# Patient Record
Sex: Male | Born: 1970 | Race: White | Hispanic: No | Marital: Married | Smoking: Never smoker
Health system: Southern US, Community
[De-identification: ages and names within clinical notes are randomized; demographics above are authoritative.]

## PROBLEM LIST (undated history)

## (undated) DIAGNOSIS — K602 Anal fissure, unspecified: Secondary | ICD-10-CM

## (undated) DIAGNOSIS — R0981 Nasal congestion: Secondary | ICD-10-CM

## (undated) DIAGNOSIS — M1990 Unspecified osteoarthritis, unspecified site: Secondary | ICD-10-CM

## (undated) DIAGNOSIS — K921 Melena: Secondary | ICD-10-CM

## (undated) DIAGNOSIS — C801 Malignant (primary) neoplasm, unspecified: Secondary | ICD-10-CM

## (undated) DIAGNOSIS — G43909 Migraine, unspecified, not intractable, without status migrainosus: Secondary | ICD-10-CM

## (undated) HISTORY — PX: REPAIR HYPOSPADIAS W/ URETHROPLASTY: SUR1184

## (undated) HISTORY — DX: Nasal congestion: R09.81

## (undated) HISTORY — DX: Unspecified osteoarthritis, unspecified site: M19.90

## (undated) HISTORY — DX: Anal fissure, unspecified: K60.2

## (undated) HISTORY — PX: LIPOMA EXCISION: SHX5283

## (undated) HISTORY — DX: Malignant (primary) neoplasm, unspecified: C80.1

## (undated) HISTORY — DX: Migraine, unspecified, not intractable, without status migrainosus: G43.909

## (undated) HISTORY — DX: Melena: K92.1

---

## 1998-12-22 ENCOUNTER — Ambulatory Visit (HOSPITAL_BASED_OUTPATIENT_CLINIC_OR_DEPARTMENT_OTHER): Admission: RE | Admit: 1998-12-22 | Discharge: 1998-12-22 | Payer: Self-pay | Admitting: General Surgery

## 2000-03-20 HISTORY — PX: ANAL FISSURE REPAIR: SHX2312

## 2000-03-20 HISTORY — PX: PILONIDAL CYST EXCISION: SHX744

## 2001-06-24 ENCOUNTER — Ambulatory Visit (HOSPITAL_BASED_OUTPATIENT_CLINIC_OR_DEPARTMENT_OTHER): Admission: RE | Admit: 2001-06-24 | Discharge: 2001-06-24 | Payer: Self-pay | Admitting: General Surgery

## 2001-06-24 ENCOUNTER — Encounter (INDEPENDENT_AMBULATORY_CARE_PROVIDER_SITE_OTHER): Payer: Self-pay | Admitting: *Deleted

## 2001-11-12 ENCOUNTER — Ambulatory Visit (HOSPITAL_BASED_OUTPATIENT_CLINIC_OR_DEPARTMENT_OTHER): Admission: RE | Admit: 2001-11-12 | Discharge: 2001-11-12 | Payer: Self-pay | Admitting: General Surgery

## 2001-11-12 ENCOUNTER — Encounter (INDEPENDENT_AMBULATORY_CARE_PROVIDER_SITE_OTHER): Payer: Self-pay | Admitting: *Deleted

## 2003-03-12 ENCOUNTER — Emergency Department (HOSPITAL_COMMUNITY): Admission: AD | Admit: 2003-03-12 | Discharge: 2003-03-12 | Payer: Self-pay | Admitting: Family Medicine

## 2004-03-18 ENCOUNTER — Ambulatory Visit: Payer: Self-pay | Admitting: Family Medicine

## 2004-08-12 ENCOUNTER — Ambulatory Visit: Payer: Self-pay | Admitting: Family Medicine

## 2004-12-01 ENCOUNTER — Ambulatory Visit: Payer: Self-pay | Admitting: Family Medicine

## 2005-01-28 ENCOUNTER — Ambulatory Visit: Payer: Self-pay | Admitting: Family Medicine

## 2005-03-22 ENCOUNTER — Ambulatory Visit: Payer: Self-pay | Admitting: Family Medicine

## 2005-04-24 ENCOUNTER — Ambulatory Visit: Payer: Self-pay | Admitting: Family Medicine

## 2005-06-28 ENCOUNTER — Ambulatory Visit: Payer: Self-pay | Admitting: Family Medicine

## 2005-08-10 ENCOUNTER — Ambulatory Visit: Payer: Self-pay | Admitting: Family Medicine

## 2006-03-08 ENCOUNTER — Ambulatory Visit: Payer: Self-pay | Admitting: Family Medicine

## 2006-05-11 ENCOUNTER — Ambulatory Visit: Payer: Self-pay | Admitting: Family Medicine

## 2006-07-11 ENCOUNTER — Ambulatory Visit: Payer: Self-pay | Admitting: Family Medicine

## 2006-10-12 ENCOUNTER — Ambulatory Visit: Payer: Self-pay | Admitting: Family Medicine

## 2006-10-12 DIAGNOSIS — G43909 Migraine, unspecified, not intractable, without status migrainosus: Secondary | ICD-10-CM | POA: Insufficient documentation

## 2006-10-12 LAB — CONVERTED CEMR LAB
Bilirubin Urine: NEGATIVE
Blood in Urine, dipstick: NEGATIVE
Glucose, Urine, Semiquant: NEGATIVE
Protein, U semiquant: NEGATIVE
Specific Gravity, Urine: 1020

## 2006-11-20 DIAGNOSIS — J069 Acute upper respiratory infection, unspecified: Secondary | ICD-10-CM | POA: Insufficient documentation

## 2006-11-27 ENCOUNTER — Ambulatory Visit: Payer: Self-pay | Admitting: Family Medicine

## 2006-12-13 ENCOUNTER — Ambulatory Visit: Payer: Self-pay | Admitting: Family Medicine

## 2006-12-13 DIAGNOSIS — J45909 Unspecified asthma, uncomplicated: Secondary | ICD-10-CM | POA: Insufficient documentation

## 2007-04-05 ENCOUNTER — Ambulatory Visit: Payer: Self-pay | Admitting: Family Medicine

## 2007-04-05 LAB — CONVERTED CEMR LAB: Rapid Strep: NEGATIVE

## 2007-05-31 ENCOUNTER — Telehealth: Payer: Self-pay | Admitting: Family Medicine

## 2007-05-31 ENCOUNTER — Ambulatory Visit (HOSPITAL_BASED_OUTPATIENT_CLINIC_OR_DEPARTMENT_OTHER): Admission: RE | Admit: 2007-05-31 | Discharge: 2007-05-31 | Payer: Self-pay | Admitting: Urology

## 2007-06-19 ENCOUNTER — Telehealth: Payer: Self-pay | Admitting: Family Medicine

## 2007-07-01 ENCOUNTER — Ambulatory Visit: Payer: Self-pay | Admitting: Family Medicine

## 2007-07-01 DIAGNOSIS — D696 Thrombocytopenia, unspecified: Secondary | ICD-10-CM | POA: Insufficient documentation

## 2007-07-01 LAB — CONVERTED CEMR LAB
Eosinophils Relative: 1.2 % (ref 0.0–5.0)
HCT: 45.1 % (ref 39.0–52.0)
Lymphocytes Relative: 26.9 % (ref 12.0–46.0)
Monocytes Absolute: 0.4 10*3/uL (ref 0.1–1.0)
Monocytes Relative: 8 % (ref 3.0–12.0)
Platelets: 123 10*3/uL — ABNORMAL LOW (ref 150–400)
WBC: 5.6 10*3/uL (ref 4.5–10.5)

## 2008-03-19 ENCOUNTER — Telehealth: Payer: Self-pay | Admitting: *Deleted

## 2008-08-04 DIAGNOSIS — K5289 Other specified noninfective gastroenteritis and colitis: Secondary | ICD-10-CM

## 2008-08-05 ENCOUNTER — Ambulatory Visit: Payer: Self-pay | Admitting: Family Medicine

## 2008-10-09 ENCOUNTER — Ambulatory Visit: Payer: Self-pay | Admitting: Family Medicine

## 2008-10-09 DIAGNOSIS — H10029 Other mucopurulent conjunctivitis, unspecified eye: Secondary | ICD-10-CM

## 2008-11-13 ENCOUNTER — Ambulatory Visit: Payer: Self-pay | Admitting: Family Medicine

## 2008-11-13 DIAGNOSIS — J309 Allergic rhinitis, unspecified: Secondary | ICD-10-CM | POA: Insufficient documentation

## 2009-02-15 ENCOUNTER — Ambulatory Visit: Payer: Self-pay | Admitting: Family Medicine

## 2009-05-25 ENCOUNTER — Ambulatory Visit: Payer: Self-pay | Admitting: Family Medicine

## 2010-01-18 HISTORY — PX: SKIN CANCER EXCISION: SHX779

## 2010-01-28 ENCOUNTER — Ambulatory Visit: Payer: Self-pay | Admitting: Family Medicine

## 2010-01-28 DIAGNOSIS — J019 Acute sinusitis, unspecified: Secondary | ICD-10-CM | POA: Insufficient documentation

## 2010-04-19 NOTE — Assessment & Plan Note (Signed)
Summary: SINUSITIS? // RS   Vital Signs:  Patient profile:   40 year old male Temp:     98.2 degrees F oral BP sitting:   120 / 90  (left arm) Cuff size:   regular  Vitals Entered By: Sid Falcon LPN (January 28, 2010 2:41 PM)  History of Present Illness: Patient seen with onset sometime around mid to late October cold-like symptoms. Progressive facial pain. Seen in urgent care Center in [place] started on Keflex. Recently finished a course had relapse. Has nasal congestion, maxillary facial pain, upper teeth pain. Increased greenish nasal discharge. Using Flonase and Sudafed without much relief. Also does saline irrigation. No recent fever. Allergy to penicillin.   Allergies (verified): No Known Drug Allergies  Past History:  Past Medical History: Last updated: 10/12/2006 migraine headaches  Physical Exam  General:  Well-developed,well-nourished,in no acute distress; alert,appropriate and cooperative throughout examination Ears:  External ear exam shows no significant lesions or deformities.  Otoscopic examination reveals clear canals, tympanic membranes are intact bilaterally without bulging, retraction, inflammation or discharge. Hearing is grossly normal bilaterally. Nose:  erythematous nasal mucosa.  Some yellow colored mucus bilaterally. Mouth:  Oral mucosa and oropharynx without lesions or exudates.  Teeth in good repair. Neck:  No deformities, masses, or tenderness noted. Lungs:  Normal respiratory effort, chest expands symmetrically. Lungs are clear to auscultation, no crackles or wheezes. Heart:  Normal rate and regular rhythm. S1 and S2 normal without gallop, murmur, click, rub or other extra sounds.   Impression & Recommendations:  Problem # 1:  SINUSITIS, ACUTE (ICD-461.9)  The following medications were removed from the medication list:    Keflex 500 Mg Caps (Cephalexin) .Marland Kitchen... 2 by mouth two times a day His updated medication list for this problem  includes:    Flonase 50 Mcg/act Susp (Fluticasone propionate) ..... Uad, as needed    Cefdinir 300 Mg Caps (Cefdinir) ..... One by mouth two times a day for 10 days  Complete Medication List: 1)  Topamax 25 Mg Tabs (Topiramate) .... 4 tabs once daily 2)  Amitriptyline Hcl 50 Mg Tabs (Amitriptyline hcl) .... Take 1 tab by mouth at bedtime 3)  Reglan 10 Mg Tabs (Metoclopramide hcl) .... As needed 4)  Skelaxin 800 Mg Tabs (Metaxalone) .... As needed 5)  Flonase 50 Mcg/act Susp (Fluticasone propionate) .... Uad, as needed 6)  Relpax 20 Mg Tabs (Eletriptan hydrobromide) .... Take one tab every week 7)  Tobramycin Sulfate 0.3 % Soln (Tobramycin sulfate) .Marland Kitchen.. 1 ggt l eye two times a day until clear 8)  Cefdinir 300 Mg Caps (Cefdinir) .... One by mouth two times a day for 10 days  Patient Instructions: 1)  Acute sinusitis symptoms for less than 10 days are not helped by antibiotics. Use warm moist compresses, and over the counter decongestants( only as directed). Call if no improvement in 5-7 days, sooner if increasing pain, fever, or new symptoms.  Prescriptions: FLONASE 50 MCG/ACT  SUSP (FLUTICASONE PROPIONATE) uad, as needed  #1 x 3   Entered and Authorized by:   Evelena Peat MD   Signed by:   Evelena Peat MD on 01/28/2010   Method used:   Electronically to        [facility] [address] [phone]* (retail)       [address]       [place], [place]  [address]       Ph: [phone] or [phone]  Fax: [phone]   RxID:   [id number] CEFDINIR 300 MG CAPS (CEFDINIR) one by mouth two times a day for 10 days  #20 x 0   Entered and Authorized by:   Evelena Peat MD   Signed by:   Evelena Peat MD on 01/28/2010   Method used:   Electronically to        [facility] [address] [phone]* (retail)       [address]       [place], [place]  [address]       Ph: [phone] or [phone]       Fax: [phone]   RxID:    [id number]    Orders Added: 1)  Est. Patient Level III [03500]

## 2010-04-19 NOTE — Assessment & Plan Note (Signed)
Summary: ?PINK EYE/3PM/NJR   Vital Signs:  Patient profile:   40 year old male Height:      72 inches Weight:      187 pounds BMI:     25.45 Temp:     98.4 degrees F oral BP sitting:   120 / 80  (left arm) Cuff size:   regular  Vitals Entered By: Kern Reap CMA Duncan Dull) (May 25, 2009 3:21 PM)  Reason for Visit possible pink eye  History of Present Illness: Craig Conner is a 40 year old, married male, nonsmoker, who comes in today with a 3 day history of conjunctivitis of his left eye.  Is a 39-year-old son has had a viral infection with conjunctivitis.  He's had no viral symptoms, but just the irritated, red ice.  Vision normal  Allergies (verified): No Known Drug Allergies  Past History:  Past medical, surgical, family and social histories (including risk factors) reviewed for relevance to current acute and chronic problems.  Past Medical History: Reviewed history from 10/12/2006 and no changes required. migraine headaches  Family History: Reviewed history from 04/05/2007 and no changes required. Family Hsitory Headaches  Social History: Reviewed history from 04/05/2007 and no changes required. Occupation: Married Never Smoked Alcohol use-no Drug use-no Regular exercise-yes  Review of Systems      See HPI  Physical Exam  General:  Well-developed,well-nourished,in no acute distress; alert,appropriate and cooperative throughout examination Head:  Normocephalic and atraumatic without obvious abnormalities. No apparent alopecia or balding. Eyes:  right are normal.  There is erythema of the right conjunctiva.  The distal extender.  The cornea.  Exam negative   Problems:  Medical Problems Added: 1)  Dx of Conjunctivitis  (ICD-372.30)  Impression & Recommendations:  Problem # 1:  CONJUNCTIVITIS (ICD-372.30) Assessment New  His updated medication list for this problem includes:    Tobramycin Sulfate 0.3 % Soln (Tobramycin sulfate) .Marland Kitchen... 1 ggt l eye two  times a day until clear  Orders: Prescription Created Electronically [id number])  Complete Medication List: 1)  Topamax 25 Mg Tabs (Topiramate) .... 4 tabs once daily 2)  Amitriptyline Hcl 50 Mg Tabs (Amitriptyline hcl) .... Take 1 tab by mouth at bedtime 3)  Reglan 10 Mg Tabs (Metoclopramide hcl) .... As needed 4)  Skelaxin 800 Mg Tabs (Metaxalone) .... As needed 5)  Flonase 50 Mcg/act Susp (Fluticasone propionate) .... Uad, as needed 6)  Prednisone 20 Mg Tabs (Prednisone) .... Uad 7)  Keflex 500 Mg Caps (Cephalexin) .... 2 by mouth two times a day 8)  Relpax 20 Mg Tabs (Eletriptan hydrobromide) .... Take one tab every week 9)  Tobramycin Sulfate 0.3 % Soln (Tobramycin sulfate) .Marland Kitchen.. 1 ggt l eye two times a day until clear  Patient Instructions: 1)  use the eyedrops one drop in your left eye twice daily until clear.  Return p.r.n. Prescriptions: TOBRAMYCIN SULFATE 0.3 % SOLN (TOBRAMYCIN SULFATE) 1 ggt L eye two times a day until clear  #10 cc x 3   Entered and Authorized by:   Roderick Pee MD   Signed by:   Roderick Pee MD on 05/25/2009   Method used:   Electronically to        [facility] [address] [phone]* (retail)       [address]       [place], [place]  [address]       Ph: [phone] or [phone]  Fax: [phone]   RxID:   [id number]

## 2010-06-07 ENCOUNTER — Telehealth: Payer: Self-pay | Admitting: Family Medicine

## 2010-06-07 NOTE — Telephone Encounter (Signed)
Pt called and is wondering if he has an immunization record in his chart, if so he would like to get a copy.  Pls advise.

## 2010-06-10 NOTE — Telephone Encounter (Signed)
Left message on machine for patient  And copy mailed for patient

## 2010-08-02 NOTE — Op Note (Signed)
[name], [name]               ACCOUNT NO.:  [id number]   MEDICAL RECORD NO.:  [id number]          PATIENT TYPE:  AMB   LOCATION:  [facility]                         FACILITY:  [facility]   PHYSICIAN:  Ronald L. Earlene Plater, M.D.  DATE OF BIRTH:  [birth year]   DATE OF PROCEDURE:  05/31/2007  DATE OF DISCHARGE:                               OPERATIVE REPORT   PREOPERATIVE DIAGNOSIS:  Hematuria with urethral diverticulum.   POSTOPERATIVE DIAGNOSIS:  Hematuria with urethral diverticulum.   PROCEDURE:  Rigid urethrocystoscopy.   ATTENDING PHYSICIAN:  Lucrezia Starch. Earlene Plater, M.D.   RESIDENT PHYSICIAN:  Dr. Maudie Flakes.   ANESTHESIA:  LMA.   INDICATIONS FOR PROCEDURE:  [name] is a 40 year old white male with a  history of hypospadias repair.  He saw Dr. Earlene Plater in the office for an  episode of gross hematuria.  He had negative cytology and negative  hematuria workup but a positive NMP22.  Flexible cystourethroscopy was  undertaken in the office, but Dr. Earlene Plater was able to appreciate the  distal urethral diverticulum but was unable to fully assess it  endoscopically, and likewise was brought to the operating room for a  more thorough evaluation of this diverticulum.   PROCEDURE IN DETAIL:  Patient was brought to the operating room and  placed in a supine position.  He was correctly identified by his wrist  band, and an appropriate time out was taken.  IV antibiotics were  delivered.  He was placed in the dorsal lithotomy position with great  care taken to minimize the risk of peripheral neuropathy or compartment  syndrome.  His perineum was prepped and draped sterilely.  We began our  procedure by performing a rigid cystourethroscopy with a 22 French rigid  cystoscopic sheath with a 12 degree lens.  Sterile water as an irrigant.  On examination he does have a hypospadiac urethral meatus that is at the  level of the corona.  This was cannulated with the scope.  His urethra  soon demonstrated the ventral  diverticulum, which was smooth-walled  without any significant epithelial abnormality.  There was a small ridge  just proximal to this which is easily traversed with the cystoscope.  We  changed to the 70 degree lens and evaluated the diverticulum more  closely as well with no significant findings appreciated.  We went back  to the 12 degree scope and continued our urethrocystoscopy.  The rest of  the urethra was normal in its course and caliber.  Upon entering the  bladder, clear urine was identified.  The bladder was drained and  refilled with sterile water.  Pancystoscopy again demonstrated no  urethral abnormalities.  The ureteral orifices were noted to be in  normal anatomic position, effluxing clear urine.  There were no foreign  bodies, stones, etc.  We changed the 70 degree lens again, and this  revealed a normal cystoscopic evaluation.  We then drained his bladder,  removed the cystoscope, and this marked the end of our procedure.  The  anesthesia was  stopped.  He was awakened and taken to the PACU in stable condition.  There were no complications.  Tolerated his procedure well.   Dr. Earlene Plater was present and participated in all aspects of the case.      Craig Boston, MD      Lucrezia Starch. Earlene Plater, M.D.  Electronically Signed    BP/MEDQ  D:  05/31/2007  T:  05/31/2007  Job:  [id number]

## 2010-08-05 NOTE — Op Note (Signed)
[facility]  Patient:    HEYWOOD, TOKUNAGA Visit Number: [id number] MRN: [id number]          Service Type: DSU Location: [facility] Attending Physician:  Delsa Bern Dictated by:   Lorne Skeens. Hoxworth, M.D. Proc. Date: 06/24/01 Admit Date:  06/24/2001 Discharge Date: 06/24/2001                             Operative Report  PREOPERATIVE DIAGNOSIS: 1. Subcutaneous masses (lipomas), right forearm times two. 2. Anal fissure.  POSTOPERATIVE DIAGNOSIS: 1. Subcutaneous masses (lipomas), right forearm times two. 2. Anal fissure.  OPERATION PERFORMED: 1. Excision of subcutaneous masses, right forearm times two. 2. Lateral internal anal sphincterotomy.  SURGEON:  Lorne Skeens. Hoxworth, M.D.  ANESTHESIA:  Laryngeal mask general.  INDICATIONS FOR PROCEDURE:  The patient is a 40 year old white male who presents with two problems.  He has had some persistent anal pain and bleeding and on examination was found to have a posterior midline fissure.  He improved somewhat with conservative management but remained symptomatic and internal anal sphincterotomy has been recommended and accepted.  The nature of the procedure, its indications and risks of bleeding, infection, failure to heal and degrees of incontinence were discussed and understood preoperatively.  He also has two enlarging subcutaneous masses on his right forearm, one near the elbow measuring 3 cm and another in the midvolar aspect of the forearm, measuring 2 cm. These have been increasingly uncomfortable.  Exam was consistent with lipomas.  Excision has been recommended at the same time.  He is now brought to the operating room for these procedures.  DESCRIPTION OF PROCEDURE:  The patient was brought to the operating room and placed in supine position on the operating table and laryngeal mask anesthesia was induced.  He was given preoperative Ancef.  The right arm was  sterilely prepped and draped.  Over each mass a small longitudinal incision was made and dissection carried down into the subcutaneous tissues and in each case, a very discrete fatty tumor was easily shelled out of its surrounding tissue and removed intact.  Hemostasis was obtained with pressure and subcutaneous was closed with interrupted 4-0 Monocryl and the skin with subcuticular 4-0 Monocryl and Steri-Strips.  Dry sterile dressings were applied.  The patient was then carefully positioned in lithotomy position and the perineum sterilely prepped and draped.   The anus was gently dilated three fingers and  retractor placed which showed some mild external hemorrhoids.  There was a posterior midline fissure that  showed some evidence of healing.  The internal anal sphincter was palpable but did not feel particularly hypertrophied.  The intersphincteric groove was palpated in the right lateral position and a small stab wound made in the anoderm.  The tip of the hemostat was positioned in the intersphincteric groove and the internal sphincter encircled and elevated up into the incision and divided with cautery.  Hemostasis was obtained with pressure.  The area was infiltrated with Marcaine with epinephrine.  A dry gauze dressing was applied.  The patient was taken to the recovery room in good condition. Dictated by:   Lorne Skeens. Hoxworth, M.D. Attending Physician:  Delsa Bern DD:  06/24/01 TD:  06/24/01 Job: [id number]

## 2010-08-05 NOTE — Op Note (Signed)
Craig Conner, Craig Conner                         ACCOUNT NO.:  [id number]   MEDICAL RECORD NO.:  [id number]                   PATIENT TYPE:  AMB   LOCATION:  [facility]                                  FACILITY:  [facility]   PHYSICIAN:  Sharlet Salina T. Hoxworth, M.D.          DATE OF BIRTH:  [birth year]   DATE OF PROCEDURE:  11/12/2001  DATE OF DISCHARGE:                                 OPERATIVE REPORT   PREOPERATIVE DIAGNOSES:  1. Lipoma, right upper arm.  2. Fistula in ano.   POSTOPERATIVE DIAGNOSES:  1. Lipoma, right upper arm.  2. Fistula in ano.   PROCEDURES:  1. Excision of lipoma, right upper arm.  2. Anal fistulotomy.   SURGEON:  Lorne Skeens. Hoxworth, M.D.   ANESTHESIA:  Laryngeal mask general.   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  Anal fistulotomy has been recommended and accepted.  The nature of the procedure, indications, risks of bleeding, infection, and  slight risk and degrees of incontinence were discussed and understood  preoperatively.  He also has an enlarging, uncomfortable lipoma in his right  upper arm that he would like excised at the same time.   DESCRIPTION OF PROCEDURE:  The patient was brought to the operating room and  placed in the supine position on the operating table, and laryngeal mask  anesthesia induced.  The right arm was sterilely prepped and draped.  A  small transverse incision was made and approximately 2.5 cm lipoma easily  bluntly dissected out of the subcutaneous tissue and excised and the skin  closed with a subcuticular 4-0 Monocryl and a Steri-Strip.  Following this,  he was carefully positioned in the lithotomy position and the perineum  sterilely prepped and draped.   There was an external fistula opening in the  right posterior anus.  With a bullet retractor in place, a probe was easily  passed from this site to the right lateral anal verge at the site of his  previous sphincterotomy incision.  The patient was anesthetized with  Marcaine with epinephrine and the tissue on top of the probe between the two  openings was completely excised sharply and with cautery.  This appeared to  traverse a small portion of the internal sphincter.  The fistula was  completely divided.  Hemostasis was obtained with cautery.  The soft tissue  was further infiltrated with Marcaine.  Hemostasis was assured.  A light  gauze dressing was applied.  The patient was taken to recovery in good  condition.  Lorne Skeens. Hoxworth, M.D.   Tory Emerald  D:  11/12/2001  T:  11/14/2001  Job:  [id number]

## 2010-11-22 ENCOUNTER — Telehealth: Payer: Self-pay | Admitting: *Deleted

## 2010-11-22 NOTE — Telephone Encounter (Signed)
Pt pulled deer tick off this am and is asking if he should come in or take any antibiotics.  Dr. Nelida Meuse pt.

## 2010-11-22 NOTE — Telephone Encounter (Signed)
Pt has no symptoms, and will call if he does.

## 2010-11-22 NOTE — Telephone Encounter (Signed)
Not based just on tick bite.  If he has any rash around site of bite, unexplained fever, arthralgias, etc then should be seen.

## 2010-12-05 ENCOUNTER — Other Ambulatory Visit: Payer: Self-pay | Admitting: Family Medicine

## 2010-12-05 ENCOUNTER — Telehealth: Payer: Self-pay | Admitting: *Deleted

## 2010-12-05 DIAGNOSIS — K5289 Other specified noninfective gastroenteritis and colitis: Secondary | ICD-10-CM

## 2010-12-05 DIAGNOSIS — Z1211 Encounter for screening for malignant neoplasm of colon: Secondary | ICD-10-CM

## 2010-12-05 NOTE — Telephone Encounter (Signed)
Ok to ref. To GI

## 2010-12-05 NOTE — Telephone Encounter (Signed)
Pt is asking for a referral for a colonoscopy.  Please call him to discuss this.

## 2010-12-12 LAB — DIFFERENTIAL
Basophils Relative: 0
Eosinophils Absolute: 0.1
Eosinophils Relative: 2
Monocytes Absolute: 0.4
Monocytes Relative: 8

## 2010-12-12 LAB — CBC
HCT: 43.6
Hemoglobin: 15.4
MCHC: 35.4
MCV: 87.3
RBC: 4.99

## 2010-12-18 ENCOUNTER — Emergency Department (HOSPITAL_COMMUNITY): Payer: Managed Care, Other (non HMO)

## 2010-12-18 ENCOUNTER — Inpatient Hospital Stay (HOSPITAL_COMMUNITY): Payer: Managed Care, Other (non HMO)

## 2010-12-18 ENCOUNTER — Inpatient Hospital Stay (HOSPITAL_COMMUNITY)
Admission: EM | Admit: 2010-12-18 | Discharge: 2010-12-20 | DRG: 392 | Disposition: A | Discharge status: Home / Self Care | Payer: Managed Care, Other (non HMO) | Attending: Internal Medicine | Admitting: Internal Medicine

## 2010-12-18 DIAGNOSIS — N361 Urethral diverticulum: Secondary | ICD-10-CM | POA: Diagnosis present

## 2010-12-18 DIAGNOSIS — R0989 Other specified symptoms and signs involving the circulatory and respiratory systems: Secondary | ICD-10-CM | POA: Diagnosis present

## 2010-12-18 DIAGNOSIS — R0602 Shortness of breath: Secondary | ICD-10-CM | POA: Diagnosis present

## 2010-12-18 DIAGNOSIS — D696 Thrombocytopenia, unspecified: Secondary | ICD-10-CM | POA: Diagnosis present

## 2010-12-18 DIAGNOSIS — R0609 Other forms of dyspnea: Secondary | ICD-10-CM | POA: Diagnosis present

## 2010-12-18 DIAGNOSIS — R300 Dysuria: Secondary | ICD-10-CM | POA: Diagnosis present

## 2010-12-18 DIAGNOSIS — M545 Low back pain, unspecified: Secondary | ICD-10-CM | POA: Diagnosis present

## 2010-12-18 DIAGNOSIS — R509 Fever, unspecified: Secondary | ICD-10-CM | POA: Diagnosis present

## 2010-12-18 DIAGNOSIS — R112 Nausea with vomiting, unspecified: Secondary | ICD-10-CM | POA: Diagnosis present

## 2010-12-18 DIAGNOSIS — R000 Tachycardia, unspecified: Secondary | ICD-10-CM | POA: Diagnosis present

## 2010-12-18 DIAGNOSIS — I959 Hypotension, unspecified: Secondary | ICD-10-CM | POA: Diagnosis present

## 2010-12-18 DIAGNOSIS — R109 Unspecified abdominal pain: Principal | ICD-10-CM | POA: Diagnosis present

## 2010-12-18 LAB — URINALYSIS, ROUTINE W REFLEX MICROSCOPIC
Glucose, UA: NEGATIVE mg/dL
Ketones, ur: >80 mg/dL — AB
Leukocytes, UA: NEGATIVE
Nitrite: NEGATIVE
Protein, ur: NEGATIVE mg/dL

## 2010-12-18 LAB — LACTIC ACID, PLASMA: Lactic Acid, Venous: 2.1 mmol/L (ref 0.5–2.2)

## 2010-12-18 LAB — PROCALCITONIN: Procalcitonin: 1.08 ng/mL

## 2010-12-18 LAB — COMPREHENSIVE METABOLIC PANEL
AST: 16 U/L (ref 0–37)
Albumin: 3.8 g/dL (ref 3.5–5.2)
BUN: 13 mg/dL (ref 6–23)
CO2: 23 mEq/L (ref 19–32)
Calcium: 9.2 mg/dL (ref 8.4–10.5)
Creatinine, Ser: 1.25 mg/dL (ref 0.50–1.35)
GFR calc non Af Amer: >60 mL/min (ref >60)

## 2010-12-18 LAB — DIFFERENTIAL
Basophils Absolute: 0 10*3/uL (ref 0.0–0.1)
Basophils Relative: 0 % (ref 0–1)
Eosinophils Absolute: 0 10*3/uL (ref 0.0–0.7)
Lymphs Abs: 0.5 10*3/uL — ABNORMAL LOW (ref 0.7–4.0)
Neutro Abs: 8.5 10*3/uL — ABNORMAL HIGH (ref 1.7–7.7)

## 2010-12-18 LAB — CBC
MCH: 31.2 pg (ref 26.0–34.0)
MCHC: 36.4 g/dL — ABNORMAL HIGH (ref 30.0–36.0)
MCV: 85.6 fL (ref 78.0–100.0)
Platelets: 109 10*3/uL — ABNORMAL LOW (ref 150–400)

## 2010-12-18 MED ORDER — IOHEXOL 300 MG/ML  SOLN
100.0000 mL | Freq: Once | INTRAMUSCULAR | Status: AC | PRN
  Administered 2010-12-18: 100 mL via INTRAVENOUS

## 2010-12-19 ENCOUNTER — Inpatient Hospital Stay (HOSPITAL_COMMUNITY): Payer: Managed Care, Other (non HMO)

## 2010-12-19 DIAGNOSIS — R509 Fever, unspecified: Secondary | ICD-10-CM

## 2010-12-19 LAB — COMPREHENSIVE METABOLIC PANEL
ALT: 20 U/L (ref 0–53)
AST: 20 U/L (ref 0–37)
Albumin: 3.2 g/dL — ABNORMAL LOW (ref 3.5–5.2)
CO2: 25 mEq/L (ref 19–32)
Calcium: 8.4 mg/dL (ref 8.4–10.5)
Creatinine, Ser: 0.94 mg/dL (ref 0.50–1.35)
GFR calc non Af Amer: >90 mL/min (ref >90)
Sodium: 136 mEq/L (ref 135–145)
Total Protein: 6.4 g/dL (ref 6.0–8.3)

## 2010-12-19 LAB — SEDIMENTATION RATE: Sed Rate: 27 mm/hr — ABNORMAL HIGH (ref 0–16)

## 2010-12-19 LAB — CBC
HCT: 39.7 % (ref 39.0–52.0)
Hemoglobin: 13.8 g/dL (ref 13.0–17.0)
MCH: 30.6 pg (ref 26.0–34.0)
MCHC: 34.8 g/dL (ref 30.0–36.0)
MCV: 88 fL (ref 78.0–100.0)
RBC: 4.51 MIL/uL (ref 4.22–5.81)

## 2010-12-19 MED ORDER — IOHEXOL 300 MG/ML  SOLN
80.0000 mL | Freq: Once | INTRAMUSCULAR | Status: AC | PRN
  Administered 2010-12-19: 80 mL via INTRAVENOUS

## 2010-12-20 LAB — DIFFERENTIAL
Lymphocytes Relative: 17 % (ref 12–46)
Lymphs Abs: 0.7 10*3/uL (ref 0.7–4.0)
Monocytes Absolute: 0.6 10*3/uL (ref 0.1–1.0)
Monocytes Relative: 15 % — ABNORMAL HIGH (ref 3–12)
Neutro Abs: 2.7 10*3/uL (ref 1.7–7.7)

## 2010-12-20 LAB — BASIC METABOLIC PANEL
BUN: 6 mg/dL (ref 6–23)
Calcium: 8.6 mg/dL (ref 8.4–10.5)
GFR calc non Af Amer: >90 mL/min (ref >90)
Glucose, Bld: 95 mg/dL (ref 70–99)
Potassium: 3.8 mEq/L (ref 3.5–5.1)

## 2010-12-20 LAB — CBC
HCT: 36.2 % — ABNORMAL LOW (ref 39.0–52.0)
Hemoglobin: 12.6 g/dL — ABNORMAL LOW (ref 13.0–17.0)
MCH: 30.5 pg (ref 26.0–34.0)
MCHC: 34.8 g/dL (ref 30.0–36.0)

## 2010-12-20 NOTE — Consult Note (Addendum)
NAMEAUDEL, COAKLEY               ACCOUNT NO.:  [id number]  MEDICAL RECORD NO.:  [id number]  LOCATION:  1402                         FACILITY:  [facility]  PHYSICIAN:  Judyann Munson, MD     DATE OF BIRTH:  [birth year]  DATE OF CONSULTATION:  12/19/2010 DATE OF DISCHARGE:                                CONSULTATION   REASON FOR CONSULTATION:  Ongoing fevers despite antibiotics.  HISTORY OF PRESENT ILLNESS:  Mr. Mercier is a 40 year old Caucasian male with history of urethral diverticulum, anal fissures, and migraine headaches who is otherwise in good health. He presents with 4-day history of painful urination, pelvic abdominal pain which progresively worsened with acute onset of fevers to 102.29F with  chills. He also noted to have episode of migraine headache that has been complicated by nausea and vomiting.  The patient thought that he had the recurrence of a prostate infection and thus went to his urgent care. At urgent care, he was noted to have high fever of greater than 102F and was referred to the emergency room for further evaluation.  Due to his presentation he was started empirically on ciprofloxacin  concerning for a GU infection.  He underwent ABd/Pelvic CT imaging as well as laboratory work that failed to confirm any urinary tract infection nor pyelonephritis.  He also was describing having shortness of breath and recent airplane travel, returning from [place] 2 days prior to admit, thus he underwent a CTPA which ruled out a PE.   The patient is known to travel for his work, going to [place], but predominantly working in [place] for hte past 6 mon. He works 2 hour south of [address] he received all his pre-travel immunizations, including influenza vax received 2wks ago. He takes  cipro for travellers diarrhea, last dose taken 6 wks ago. he does not take malaria prophylaxis since he is in low-malaria endemic area. he stays in the city, does not drink well water, no adventure tourism,no  swimming in fresh water lakes, no time spent  on farms. On his recent trip to [place], no excursion to farms, did not stay in any places where he noted animal droppings, such as mice, pidgeon or bats. no unprotected sex nor illicit drug use while in the [place] nor when travelling.  This is the first time for which he has had significant fevers and chills associated with having prostate infection. The patient states that he now feels that his fevers have resided but he does remember taking tylenol roughly 8 hours ago.   On admit, the patient had a white count of 9.6, 89% neutrophils. He does have evidence of  thrombocytopenia with a platelet count of 109; however, that is close to his baseline and no other abnormality.  The primary team has asked Infectious Diseases to see the patient to see if any further testing will be required to determine the etiology of this patient's presentation since his current work-up is negative.  The patient states that initially his symptoms were concerning to him as having urinary tract infection or prostatitis for which the last time he has taken antibiotics roughly 6 to 7 months ago for prostate infection. In regards to his  dysuria, he also noted having low back spasm as some lower abdominal/upper thigh discomfort, which is new for him. he denies any hematuria. he took an Investment banker, operational  which improved his symptoms prior to coming to the hospital. His associated N/V was occurring in the setting of recurrent migraine headache.    PAST MEDICAL HISTORY: 1. Migraine headaches. 2. Urethral diverticulum, noted in 2009. 3. Anal fissure, noted in 2003. 4. Allergic rhinitis. 5. Hemorrhoid and repair of hypospadias. 6. History of thrombocytopenia noted on labs in 2009.  MEDICATIONS:  From his home include: 1. Imipramine 25 mg t.i.d. 2. Relpax 40 mg p.r.n. 3. Reglan 10 mg p.r.n. 4. Zyrtec 10 mg daily.  ANTIBIOTICS: ciprofloxacin day #3.  ALLERGIES:  No known medical  allergies.  SOCIAL HISTORY:  Patient is married, has one child.  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  Works back and forth from [place].  Works [employer] from [address] as well as goes to [place], [place].  He does not smoke.  No drinking.  No illicit drug use.  No tattoos.  FAMILY HISTORY:  Significant for coronary artery disease.  REVIEW OF SYSTEMS:  In general, still has malaise.  When he has headaches he has occasionally photophobia, neck pain, and can have intermittent nausea with rare vomiting.  He denies any sore throat or cough, did have some shortness of breath, but now that has resolved.  No wheezing. No chest pain. Currently, no abdominal pain, diarrhea, or constipation, and denies any low back pain or costovertebral tenderness and no joint pain.  He denies any rash to the skin.  Twelve-point review of system is otherwise negative.  PHYSICAL EXAMINATION:  VITAL SIGNS:  His T-max was 100.9, T current was 100.4, pulse of 92, blood pressure 113/75, respiration rate 18, 99% on room air. GENERAL:  This is a pleasant, age appropriate male, in no acute distress. HEENT:  Normocephalic, atraumatic.  PERRLA, EOMI.  No scleral icterus. Oropharynx is moist.  No pharyngeal erythema. NECK:  Supple, no lymphadenopathy, no JVD, no bruits.  No nuchal rigidity. PULMONARY:  Clear to auscultation bilaterally.  No wheezes, crackles, or rhonchi. CARDIA:  Normal S1, S2.  No gallops, murmurs, or rubs. ABDOMEN:  Positive bowel sounds.  No tenderness to palpation.  No rebound, no guarding. BACK:  No costovertebral angle tenderness. NEUROLOGIC:  Cranial nerves II-XII are grossly intact.  Motor of 5/5 is intact in upper and lower extremities.  2+ reflexes and sensation is intact. SKIN:  No rash.  no janeways lesion, no osler nodes, no splinter hemmorhages--No stigmata of endocarditis.  LABS:  White count on admit is 9.6, 89% neutrophils, hematocrit of 40.9, hemoglobin of 14.9, platelets of 109. WBC on  10/1 is  6.3, creatinine of 1.25, ALT of 18, AST of 16, alk phos 76, T bili of 1.1. UA is negative for blood, leukoesterase, nitrites, but positive for ketones.    MICRO:  12/08/10: peripheral blood cx x 2 : no growth to date.  IMAGING: 1. Chest x-ray, no acute cardiopulmonary disease. 2. CT of the abdomen and pelvis, negative for any acute findings. 3. CT angio chest, no filling defect in the pulmonary territory to     suggest PE.  There is trace bilateral pleural effusion with     dependant subsegmental atelectasis.   ASSESSMENT AND PLAN:  This is a 40 year old male with history of urethral diverticulum with recurrent prostatitis and poorly controlled migraine headaches.  He presents with fevers x4 days, some dysuria, pelvic abdominal  pain, and nausea and vomiting, likely compounded by symptoms of having a migraine.  He does have significant travel history to [place] and [place] that may also influence on his presentation.  His current workup is negative for any urinary tract infection, prostatitis, nor pyelonephritis.  Currently, the patient has defervesced; however, he is receiving Tylenol and his white count has trended down from 9.6 to 6.3 with the empiric therapy of ciprofloxacin.   1) We recommend to continue him empirically until his blood culture returns.   2) would consider work up of " fever of unknown origin" .We will recommend checking an ESR and CRP as well as an ANA and rheumatoid factor as well as an LDH and HIV testing. Patient has given verbal consent to do HIV testing. 3)  If patient is to have recurrent fever, we will get repeat two sets of peripheral blood culture as well as a malaria smear, although this is thought to be less likely, given he travels to low density malaria area.  4) If his cultures identify any bacteria, we will narrow his antibiotics appropirately. 5) recommend changing his IV ciprofloxacin to oral regimen since it has the  same bioavailability.    We look forward to continue to follow Mr. Fuchs. It has been a pleasure to take part of his care. Thank you for the consultation          ______________________________ Judyann Munson, MD     CS/MEDQ  D:  12/19/2010  T:  12/20/2010  Job:  [id number]  Electronically Signed by Judyann Munson MD on 12/20/2010 09:19:35 AM

## 2010-12-22 ENCOUNTER — Telehealth: Payer: Self-pay | Admitting: Family Medicine

## 2010-12-22 DIAGNOSIS — R509 Fever, unspecified: Secondary | ICD-10-CM

## 2010-12-22 NOTE — Telephone Encounter (Signed)
Pt is coming in on the 11th for a hospital follow up and had questions regarding the medication he was put on. Pt requesting you contact him.

## 2010-12-23 NOTE — Telephone Encounter (Signed)
patient  Was given cipro at the ER for infection and fever of 105.  He was having body aches also.  Patient has a follow up appointment next week and wanted to know if he should continue the medication and would like to be tested for rocky mountain fever.  I advised him to continue his medication and discuss testing at his appointment next week with Dr Tawanna Cooler.  FYI

## 2010-12-24 LAB — CULTURE, BLOOD (ROUTINE X 2)
Culture  Setup Time: 201209301703
Culture: NO GROWTH
Culture: NO GROWTH

## 2010-12-24 NOTE — Discharge Summary (Addendum)
Craig Conner, Craig Conner               ACCOUNT NO.:  [id number]  MEDICAL RECORD NO.:  [id number]  LOCATION:  1402                         [facility]AC[facility]  PHYSICIAN:  Ladell Pier, M.D.   DATE OF BIRTH:  [birth year]  DATE OF ADMISSION:  12/18/2010 DATE OF DISCHARGE:  12/20/2010                        DISCHARGE SUMMARY - REFERRING   PRIMARY CARE PROVIDER:  Tinnie Gens A. Tawanna Cooler, MD  DISCHARGE DIAGNOSES: 1. Persistent fever/nausea/vomiting/abdominal pain. 2. Dyspnea. 3. Hypotension. 4. History of urethral diverticulum. 5. History of migraine. 6. Thrombocytopenia.  DISCHARGE MEDICATIONS: 1. Cipro 500 mg p.o. b.i.d. for 5 more days. 2. Relpax 40 mg p.o. 1 tablet daily as needed for headache, do not     exceed 2 doses in 24 hours. 3. Zyrtec 10 mg p.o. daily. 4. Metoclopramide 10 mg p.o. every 6 hours as needed for     nausea/vomiting. 5. Imipramine 25 mg 3 tablets p.o. daily.  DIAGNOSTIC LABS:  WBC 9.6, hemoglobin 14.9, hematocrit 40.9, platelets 109, neutrophils 89%, absolute neutrophils 8.5.  Sodium 133, potassium 3.7, chloride 96, CO2 of 23, BUN 13, creatinine 1.25, glucose 99. Lactic acid 2.1.  Procalcitonin 1.08.  Urinalysis was negative.  Blood cultures showed no growth to date.  Total LDH 128.  ESR 27.  HIV antibody nonreactive.  C-reactive protein 12.85.  RA factor less than 10.  ANA negative.  DIAGNOSTIC IMAGING: 1. Chest x-ray on September 30th yields no acute cardiopulmonary     disease.  Specifically no evidence of pneumonia. 2. CT of the abdomen and pelvis on September 30th shows no acute     findings. 3. CT angio of the chest on October 1st shows no filling defect is     identified in the pulmonary arterial tree to suggest pulmonary     embolus.  Trace bilateral pleural effusions with dependant     subsegmental atelectasis.  CONSULTS:  Judyann Munson, MD with Infectious Disease on October 2nd.  PROCEDURES:  None.  BRIEF HISTORY:  Craig Conner is a 40 year old male  with a past medical history significant for anal fissure and urethral diverticulum.  He presented to the [facility] ED with a chief complaint of persistent abdominopelvic pain, shortness of breath, nausea, vomiting, and pain across vertebral angle.  He indicates that he has been traveling routinely between here and [place] over the last 6 months.  Most recently he was in [place].  Prior to coming back from that trip, he began feeling pelvic pain that progressed to a back pain.  Over the next 2 days, it worsened and just prior to his presentation in the ED, he developed pain with urination, nausea, vomiting, chills, and fever.  He reported he was not sure how high his temperature was at home and he also experienced some shortness of breath.  He went to the Urgent Care and was transported to the emergency room from there.  In the emergency room, he received IV fluids, IV antibiotics.  At the time of his initial encounter with the hospitalist, he began to feel better with decreased shortness of breath, nausea and vomiting had improved.  He denied chest pain, but does have a history of migraines.  He was admitted for further  evaluation and treatment.  1. Four-day history of persistent fever/nausea/vomiting/abdominal     pain.  The patient was admitted to telemetry unit.  He was started     on Cipro IV for fever, nausea, and vomiting.  His urinalysis was     normal.  CT scan of the abdomen was within the limits of normal.     After 24 to 36 hours, he began to improve.  He was supported with     IV fluids, clear liquids.  He had 1 episode of nausea and no     emesis.  At the time of discharge, he is pain free, has not had a     fever in over 24 hours, is tolerating a regular diet, and has no     more abdominal pain.  He was seen on October 1st by Dr. Drue Second with     Infectious Disease as the source of his fever had not yet been     identified.  Her recommendations included continuing him      empirically for 5 days on Cipro.  A workup for fever of unknown     origin included checking ESR, C-reactive protein, and an ANA; that     results were dictated above.  At the time of discharge, ID     recommendation is to complete a 5-day course of Cipro, to keep a     fever diary, to follow up with his primary care provider in 10     days. 2. Dyspnea secondary to 4-day history of persistent     fever/nausea/vomiting/abdominal pain.  On admission, the patient     was short of breath and slightly tachycardic.  CT angio of chest to     rule out PE was negative as dictated above.  His dyspnea quickly     resolved with antibiotics and IV hydration.  At the time of     discharge, the patient has no dyspnea. 3. Hypotension.  At presentation in the emergency room, the patient's     blood pressure was slightly hypotensive in the 90 to 100 range.     There was concern for sepsis.  Procalcitonin and lactic acid as     dictated above.  The patient responded positively to IV fluid     resuscitation, had no further episodes of hypotension during this     hospitalization. 4. History of urethral diverticulum, stable. 5. History of migraine at baseline during this hospitalization.  We     will continue home medications. 6. History of thrombocytopenia.  Chart review indicates it as somewhat     of a chronic problem.  He will follow up on an outpatient basis     with his primary care provider.  PHYSICAL EXAMINATION:  VITAL SIGNS:  Temperature 98.2, blood pressure 112/74, heart rate 78, respiratory rate is 18, saturation is 99% on room air. GENERAL:  Awake, alert, no acute distress. CV:  Regular rate and rhythm.  No murmur, gallop, or rub.  No lower extremity edema. RESPIRATORY:  Normal effort.  Breath sounds are clear to auscultation bilaterally.  No wheeze, no rhonchi. ABDOMEN:  Flat, soft, positive bowel sounds throughout, nontender to palpation. NEURO:  Alert and oriented x3.  Speech  clear.  DIET:  Regular.  ACTIVITY:  As tolerated.  FOLLOWUP:  The patient has an appointment with Dr. Kelle Darting on October 11th at 10 a.m.  The patient is to keep a fever diary during this time.  He  has not been instructed not to take Tylenol, should he develop a fever to see how high his temperature will go.  If his symptoms return, he is to come back to the emergency room.  DISPOSITION:  The patient is medically stable and ready for discharge.  Time spent on this discharge, 25 minutes.     Gwenyth Bender, NP   ______________________________ Ladell Pier, M.D.    KMB/MEDQ  D:  12/20/2010  T:  12/20/2010  Job:  [id number]  cc:   Tinnie Gens A. Tawanna Cooler, MD [address] [place] [address]  Electronically Signed by Ladell Pier M.D. on 12/24/2010 09:39:49 PM Electronically Signed by Toya Smothers  on 12/25/2010 08:26:43 AM

## 2010-12-24 NOTE — H&P (Signed)
NAMEROBERTO, Craig Conner               ACCOUNT NO.:  [id number]  MEDICAL RECORD NO.:  [id number]  LOCATION:  WLED                         FACILITY:  [facility]  PHYSICIAN:  Ladell Pier, M.D.   DATE OF BIRTH:  02/05/1971  DATE OF ADMISSION:  12/18/2010 DATE OF DISCHARGE:                             HISTORY & PHYSICAL   CHIEF COMPLAINT:  Abdominopelvic pain, shortness of breath, nausea, vomiting and pain across her vertebral ankle.  HISTORY OF PRESENT ILLNESS:  The patient is a 40 year old white male with past medical history significant for anal fissure and urethral diverticulum.  The patient stated that he for the past 6 months has been traveling back and forth to [place].  He stated that he was in [place] about Thursday prior to coming back he started feeling a little bit of pelvic pain and then by Friday when he got back the pain got worse. That is 2 days ago and then yesterday he started developing some pain with urination and nausea, vomiting, shaking chills and fever.  He is not sure how his temperature went at home, he also had some shortness of breath that today he went to the urgent care.  In the Urgent Care, he was transported to the emergency department.  He stated that since he has been in the emergency department getting IV fluids, IV antibiotic. He feels much better.  The shortness of breath is much better.  The nausea, vomiting has improved.  He has no chest pain.  He does have a history of migraine headaches.  He does not have any headaches at this time he has pain more in the costovertebral and will spinal area  of this with the patient's urologist, Dr. Wanda Plump and the patient's primary care [place] Dr. Tawanna Cooler and the patient's surgeon, Dr. Johna Sheriff.  PAST MEDICAL HISTORY:  Significant for, 1. Anal fissures. 2. Migraine headaches. 3. Allergic rhinitis. 4. Urethral diverticulum.  FAMILY HISTORY:  Father died of heart attack.  Mother is healthy.  SOCIAL HISTORY:  He  does not smoke.  He does not drink alcohol.  He is married.  He has 1 son.  He works for [employer].  MEDICATIONS: 1. He takes imipramine 25 mg 3 tablets daily. 2. Relpax 40 mg daily as needed. 3. Reglan 10 mg every 6 hours as needed. 4. Zyrtec 10 mg daily.  ALLERGIES:  None.  REVIEW OF SYSTEMS:  Negative otherwise stated in HPI.  PHYSICAL EXAMINATION:  VITAL SIGNS:  Per ED, nurse practitioner temperature has been up to 105, heart rate 94, and blood pressure 104/70, pulse ox 100% on 2 L.  GENERAL:  Patient is laying on the stretcher, well-nourished white male.  HEENT: Normocephalic, atraumatic. His pupils are reactive to light without erythema but mucous membranes dry.  CARDIOVASCULAR: He is regular rate and rhythm.  LUNGS:  Were clear bilaterally.  ABDOMEN:  Positive bowel sounds.  Soft, tender in the pelvic area, also tender in the costovertebral angle area.  EXTREMITIES: Without edema, 2+ DP pulse bilaterally.  RECTAL EXAM:  The external area looks normal.  No bleeding or blood noted.  LABORATORY DATA:  UA negative.  Procalcitonin 1.08.  WBC 9.6, hemoglobin 14.9, MCV of  85.6, platelets 109.  Sodium 133, potassium 3.7, chloride 96, CO2 23, glucose 99, BUN 13, creatinine 1.25, AST 16, ALT 18.  Lactic acid of 2.1.  ASSESSMENT AND PLAN:  Abdominal pain, nausea, vomiting, hypotension, dyspnea.  We will admit the patient to the hospital with his abdominal pain is in the pelvic area.  He just had a urinalysis now that is normal, so did a CT scan of the abdomen and pelvis that is pending.  We will put him on Cipro 400 IV q. 12 with his fevers, nausea, vomiting. We will treat him with antiemetics.  For his hypertension, we will give him IV fluids for his dyspnea.  Will also do a CT angio of the chest to rule out PE since his travel.  Multiple times within the past 6 months overseas.  Discussed the plan of therapy with his wife.  Time spent with the patient in doing this admission is  approximately 45 minutes.     Ladell Pier, M.D.     N[place]/MEDQ  D:  12/18/2010  T:  12/18/2010  Job:  [id number]  Electronically Signed by Ladell Pier M.D. on 12/24/2010 09:39:43 PM

## 2010-12-26 ENCOUNTER — Other Ambulatory Visit (INDEPENDENT_AMBULATORY_CARE_PROVIDER_SITE_OTHER): Payer: Managed Care, Other (non HMO)

## 2010-12-26 DIAGNOSIS — T148XXA Other injury of unspecified body region, initial encounter: Secondary | ICD-10-CM

## 2010-12-26 DIAGNOSIS — T148 Other injury of unspecified body region: Secondary | ICD-10-CM

## 2010-12-26 DIAGNOSIS — W57XXXA Bitten or stung by nonvenomous insect and other nonvenomous arthropods, initial encounter: Secondary | ICD-10-CM

## 2010-12-26 NOTE — Telephone Encounter (Signed)
Pt aware and will come in to the lab today at 10:30.

## 2010-12-26 NOTE — Telephone Encounter (Signed)
Please call and have him come in today for a [facility] spotted fever titer

## 2010-12-28 ENCOUNTER — Encounter: Payer: Self-pay | Admitting: Family Medicine

## 2010-12-28 ENCOUNTER — Telehealth: Payer: Self-pay | Admitting: Family Medicine

## 2010-12-28 NOTE — Telephone Encounter (Signed)
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

## 2010-12-29 ENCOUNTER — Encounter: Payer: Self-pay | Admitting: Family Medicine

## 2010-12-29 ENCOUNTER — Ambulatory Visit (INDEPENDENT_AMBULATORY_CARE_PROVIDER_SITE_OTHER): Payer: Managed Care, Other (non HMO) | Admitting: Family Medicine

## 2010-12-29 DIAGNOSIS — K5289 Other specified noninfective gastroenteritis and colitis: Secondary | ICD-10-CM

## 2010-12-29 DIAGNOSIS — J301 Allergic rhinitis due to pollen: Secondary | ICD-10-CM

## 2010-12-29 LAB — ROCKY MTN SPOTTED FVR ABS PNL(IGG+IGM): RMSF IgM: 0.31 IV

## 2010-12-29 MED ORDER — FLUTICASONE PROPIONATE 50 MCG/ACT NA SUSP: 2.0000 | Freq: Every day | NASAL | Status: DC

## 2010-12-29 NOTE — Telephone Encounter (Signed)
Pt was seen for an ov on 12/29/10 and was made aware that as soon as the results have been given he will be contacted.

## 2010-12-29 NOTE — Progress Notes (Signed)
  Subjective:    Patient ID: Craig Conner, male    DOB: [birth year], 40 y.o.   MRN: [id number]  HPI Marvelous is a 40 year old, married male, nonsmoker, who comes in today for hospital follow-up.  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  Developed fever, abdominal pain, and vomiting.  He was admitted the hospital for 4 days.  Diagnostic workup was negative.  He was sent home on Omnicef 300 mg b.i.d..  He took his last dose on Sunday.  Except for feeling fatigue.  He otherwise feels well.  We went back and reviewed his travel history.  He is extremely careful what he eats when he is out of the country and does not recall anything that he might have eaten that could be toxic.  He is to go to [place] next week on business  Review of Systems    General in GI review of systems otherwise negative Objective:   Physical Exam  Well-developed well-nourished, male in no acute distress      Assessment & Plan:  Episode of nausea, vomiting, fever, requiring hospitalization.  Etiology of infection unknown, asymptomatic now.  Plan return p.r.n.

## 2010-12-29 NOTE — Patient Instructions (Signed)
Continue current medications  Return if you have any further symptoms

## 2010-12-30 ENCOUNTER — Telehealth: Payer: Self-pay | Admitting: *Deleted

## 2010-12-30 NOTE — Telephone Encounter (Signed)
Pt called back asking for his numbers on the [facility] Spotted Fever Panel.  Given to pt.

## 2011-01-05 ENCOUNTER — Ambulatory Visit: Payer: Managed Care, Other (non HMO) | Admitting: Family Medicine

## 2011-01-12 ENCOUNTER — Encounter (INDEPENDENT_AMBULATORY_CARE_PROVIDER_SITE_OTHER): Payer: Self-pay | Admitting: General Surgery

## 2011-01-19 ENCOUNTER — Encounter (INDEPENDENT_AMBULATORY_CARE_PROVIDER_SITE_OTHER): Payer: Self-pay | Admitting: General Surgery

## 2011-01-19 ENCOUNTER — Ambulatory Visit (INDEPENDENT_AMBULATORY_CARE_PROVIDER_SITE_OTHER): Payer: Managed Care, Other (non HMO) | Admitting: General Surgery

## 2011-01-19 VITALS — BP 126/88 | HR 64 | Temp 98.0°F | Resp 20 | Ht 71.0 in | Wt 193.0 lb

## 2011-01-19 DIAGNOSIS — K602 Anal fissure, unspecified: Secondary | ICD-10-CM

## 2011-01-19 NOTE — Patient Instructions (Signed)
Call in 2-3 weeks to let me know how you're doing. Return to the office as needed.

## 2011-01-19 NOTE — Progress Notes (Signed)
History: Patient returns to the office with history of anal sphincterotomy for chronic anal fissure in 2003. In recent years he has had occasional recurrences manifested mainly by bleeding which have responded well to diltiazem cream. He was out of the country and ran out of his prescription about 3 months ago and developed some recurrent anal bleeding with bowel movements. This has persisted. He has had some mild irritation but no significant pain.  Exam: Burt exam is limited to the anorectum. There is a definite posterior midline fissure, not particularly tender the lobe of the bleeding with manipulation.  Assessment and plan: Recurrent anal fissure. We will again try diltiazem cream and if he becomes asymptomatic I do not see a need for surgery. He will call me and let me know how he is doing in 2-3 weeks.

## 2011-01-23 ENCOUNTER — Other Ambulatory Visit (INDEPENDENT_AMBULATORY_CARE_PROVIDER_SITE_OTHER): Payer: Self-pay | Admitting: General Surgery

## 2011-02-14 ENCOUNTER — Encounter: Payer: Self-pay | Admitting: Gastroenterology

## 2011-03-29 ENCOUNTER — Ambulatory Visit (INDEPENDENT_AMBULATORY_CARE_PROVIDER_SITE_OTHER): Payer: Managed Care, Other (non HMO) | Admitting: Family

## 2011-03-29 ENCOUNTER — Encounter: Payer: Self-pay | Admitting: Family

## 2011-03-29 VITALS — BP 116/80 | Temp 98.9°F | Wt 198.0 lb

## 2011-03-29 DIAGNOSIS — R300 Dysuria: Secondary | ICD-10-CM

## 2011-03-29 DIAGNOSIS — N390 Urinary tract infection, site not specified: Secondary | ICD-10-CM

## 2011-03-29 LAB — POCT URINALYSIS DIPSTICK
Bilirubin, UA: NEGATIVE
Glucose, UA: NEGATIVE
Nitrite, UA: NEGATIVE

## 2011-03-29 MED ORDER — CIPROFLOXACIN HCL 500 MG PO TABS: 500.0000 mg | ORAL_TABLET | Freq: Two times a day (BID) | ORAL | Status: AC

## 2011-03-29 NOTE — Progress Notes (Signed)
Subjective:    Patient ID: Craig Conner, male    DOB: Oct 18, 1970, 41 y.o.   MRN: [id number]  HPI 41 year old white male, patient of Dr. Tawanna Cooler is in today with complaints of bladder spasms that have been going on for 2 days, and worsening. He also is feeling achy and having low back pain. He has a history of prostatitis. He denies any concerns or any sexually-transmitted diseases. He sexually active, one male partner. Denies any frequency, urgency, blood in his urine, or foul-smelling urine.   Review of Systems  HENT: Negative.   Respiratory: Negative.   Cardiovascular: Negative.   Gastrointestinal: Positive for abdominal pain.       Pelvic pain  Genitourinary: Positive for dysuria. Negative for hematuria, discharge, penile swelling, scrotal swelling, difficulty urinating, penile pain and testicular pain.  Musculoskeletal: Negative.   Neurological: Negative.   Hematological: Negative.   Psychiatric/Behavioral: Negative.        Past Medical History  Diagnosis Date  . Migraine headache   . Arthritis   . Cancer     skin  . Blood in stool   . Nasal congestion   . Anal fissure     History   Social History  . Marital Status: Married    Spouse Name: N/A    Number of Children: N/A  . Years of Education: N/A   Occupational History  . Not on file.   Social History Main Topics  . Smoking status: Never Smoker   . Smokeless tobacco: Never Used  . Alcohol Use: Yes     rare   . Drug Use: No  . Sexually Active: Not on file   Other Topics Concern  . Not on file   Social History Narrative  . No narrative on file    Past Surgical History  Procedure Date  . Skin cancer excision 01/2010  . Anal fissure repair 2002  . Pilonidal cyst excision 2002  . Lipoma excision 2002, 2003    Family History  Problem Relation Age of Onset  . Migraines Other     No Known Allergies  Current Outpatient Prescriptions on File Prior to Visit  Medication Sig Dispense Refill  .  fluticasone (FLONASE) 50 MCG/ACT nasal spray Place 2 sprays into the nose daily.  16 g  11  . imipramine (TOFRANIL) 25 MG tablet       . metoCLOPramide (REGLAN) 10 MG tablet Take 10 mg by mouth as needed.          BP 116/80  Temp(Src) 98.9 F (37.2 C) (Oral)  Wt 198 lb (89.812 kg)chart Objective:   Physical Exam  Constitutional: He is oriented to person, place, and time. He appears well-developed and well-nourished.  Neck: Neck supple.  Cardiovascular: Normal rate, regular rhythm and normal heart sounds.   Pulmonary/Chest: Effort normal and breath sounds normal.  Abdominal: Soft. There is tenderness. There is no rebound and no guarding.       Tenderness noted over the bladder.  Genitourinary: Rectum normal and prostate normal.  Neurological: He is alert and oriented to person, place, and time.  Skin: Skin is warm and dry.  Psychiatric: He has a normal mood and affect.          Assessment & Plan:  Assessment: Acute cystitis, with a history of prostatitis, dysuria  Plan: Cipro 500 mg one tablet twice a day x14 days. He has an appointment with urology on Monday, however we'll reschedule that for 3-4 weeks from now. We will  recheck his urine or prostate at that time. Plenty of fluids. Rest. Call the office if symptoms worsen or persist, recheck as scheduled or when necessary.

## 2011-03-29 NOTE — Patient Instructions (Addendum)
Urinary Tract Infection Infections of the urinary tract can start in several places. A bladder infection (cystitis), a kidney infection (pyelonephritis), and a prostate infection (prostatitis) are different types of urinary tract infections (UTIs). They usually get better if treated with medicines (antibiotics) that kill germs. Take all the medicine until it is gone. You or your child may feel better in a few days, but TAKE ALL MEDICINE or the infection may not respond and may become more difficult to treat. HOME CARE INSTRUCTIONS   Drink enough water and fluids to keep the urine clear or pale yellow. Cranberry juice is especially recommended, in addition to large amounts of water.   Avoid caffeine, tea, and carbonated beverages. They tend to irritate the bladder.   Alcohol may irritate the prostate.   Only take over-the-counter or prescription medicines for pain, discomfort, or fever as directed by your caregiver.  To prevent further infections:  Empty the bladder often. Avoid holding urine for long periods of time.   After a bowel movement, women should cleanse from front to back. Use each tissue only once.   Empty the bladder before and after sexual intercourse.  FINDING OUT THE RESULTS OF YOUR TEST Not all test results are available during your visit. If your or your child's test results are not back during the visit, make an appointment with your caregiver to find out the results. Do not assume everything is normal if you have not heard from your caregiver or the medical facility. It is important for you to follow up on all test results. SEEK MEDICAL CARE IF:   There is back pain.   Your baby is older than 3 months with a rectal temperature of 100.5 F (38.1 C) or higher for more than 1 day.   Your or your child's problems (symptoms) are no better in 3 days. Return sooner if you or your child is getting worse.  SEEK IMMEDIATE MEDICAL CARE IF:   There is severe back pain or  lower abdominal pain.   You or your child develops chills.   You have a fever.   Your baby is older than 3 months with a rectal temperature of 102 F (38.9 C) or higher.   Your baby is 46 months old or younger with a rectal temperature of 100.4 F (38 C) or higher.   There is nausea or vomiting.   There is continued burning or discomfort with urination.  MAKE SURE YOU:   Understand these instructions.   Will watch your condition.   Will get help right away if you are not doing well or get worse.  Document Released: 12/14/2004 Document Revised: 11/16/2010 Document Reviewed: 07/19/2006 [facility] Patient Information 2012 [place], [place].  Prostatitis Prostatitis is an inflammation (the body's way of reacting to injury and/or infection) of the prostate gland. The prostate gland is a male organ. The gland is about the size and shape of a walnut. The prostate is located just below the bladder. It produces semen, which is a fluid that helps nourish and transport sperm. Prostatitis is the most common urinary tract problem in men younger than age 86. There are 4 categories of prostatitis:  I - Acute bacterial prostatitis.   II - Chronic bacterial prostatitis.   III - Chronic prostatitis and chronic pelvic pain syndrome (CPPS).   Inflammatory.   Non inflammatory.   IV - Asymptomatic inflammatory prostatitis.  Acute and chronic bacterial prostatitis are problems with bacterial infections of the prostate. "Acute" infection is usually  a one-time problem. "Chronic" bacterial prostatitis is a condition with recurrent infection. It is usually caused by the same germ(bacteria). CPPS has symptoms similar to prostate infection. However, no infection is actually found. This condition can cause problems of ongoing pain. Currently, it cannot be cured. Treatments are available and aimed at symptom control.  Asymptomatic inflammatory prostatitis has no symptoms. It is a condition where  infection-fighting cells are found by chance in the urine. The diagnosis is made most often during an exam for other conditions. Other conditions could be infertility or a high level of PSA (prostate-specific antigen) in the blood. SYMPTOMS  Symptoms can vary depending upon the type of prostatitis that exists. There can also be overlap in symptoms. This can make diagnosis difficult. Symptoms: For Acute bacterial prostatitis  Painful urination.   Fever or chills.   Muscle or joint pains.   Low back pain.   Low abdominal pain.   Inability to empty bladder completely.   Sudden urges to urinate.   Frequent urination during the day.   Difficulty starting urine stream.   Need to urinate several times at night (nocturia).   Weak urine stream.   Urethral (tube that carries urine from the bladder out of the body) discharge and dribbling after urination.  For Chronic bacterial prostatitis  Rectal pain.   Pain in the testicles, penis, or tip of the penis.   Pain in the space between the anus and scrotum (perineum).   Low back pain.   Low abdominal pain.   Problems with sexual function.   Painful ejaculation.   Bloody semen.   Inability to empty bladder completely.   Painful urination.   Sudden urges to urinate.   Frequent urination during the day.   Difficulty starting urine stream.   Need to urinate several times at night (nocturia).   Weak urine stream.   Dribbling after urination.   Urethral discharge.  For Chronic prostatitis and chronic pelvic pain syndrome (CPPS) Symptoms are the same as those for chronic bacterial prostatitis. Problems with sexual function are often the reason for seeking care. This important problem should be discussed with your caregiver. For Asymptomatic inflammatory prostatitis As noted above, there are no symptoms with this condition. DIAGNOSIS   Your caregiver may perform a rectal exam. This exam is to determine if the prostate  is swollen and tender.   Sometimes blood work is performed. This is done to see if your white blood cell count is elevated. The Prostate Specific Antigen (PSA) is also measured. PSA is a blood test that can help detect early prostate cancer.   A urinalysis is done to find out what type of infection is present if this is a suspected cause. An additional urinalysis may be done after a digital rectal exam. This is to see if white blood cells are pushed out of the prostate and into the urine. A low-grade infection of the prostate may not be found on the first urinalysis.  In more difficult cases, your caregiver may advise other tests. Tests could include:  Urodynamics -- Tests the function of the bladder and the organs involved in triggering and controlling normal urination.   Urine flow rate.   Cystoscopy -- In this procedure, a thin, telescope-like tube with a light and tiny camera attached (cystoscope) is inserted into the bladder through the urethra. This allows the caregiver to see the inside of the urethra and bladder.   Electromyography -- This procedure tests how the muscles and nerves of  the bladder work. It is focused on the muscles that control the anus and pelvic floor. These are the muscles between the anus and scrotum.  In people who show no signs of infection, certain uncommon infections might be causing constant or recurrent symptoms. These uncommon infections are difficult to detect. More work in medicine may help find solutions to these problems. TREATMENT  Antibiotics are used to treat infections caused by germs. If the infection is not treated and becomes long lasting (chronic), it may become a lower grade infection with minor, continual problems. Without treatment, the prostate may develop a boil or furuncle (abscess). This may require surgical treatment. For those with chronic prostatitis and CPPS, it is important to work closely with your primary caregiver and urologist. For some,  the medicines that are used to treat a non-cancerous, enlarged prostate (benign prostatic hypertrophy) may be helpful. Referrals to specialists other than urologists may be necessary. In rare cases when all treatments have been inadequate for pain control, an operation to remove the prostate may be recommended. This is very rare and before this is considered thorough discussion with your urologist is highly recommended.  In cases of secondary to chronic non-bacterial prostatitis, a good relationship with your urologist or primary caregiver is essential because it is often a recurrent prolonged condition that requires a good understanding of the causes and a commitment to therapy aimed at controlling your symptoms. HOME CARE INSTRUCTIONS   Hot sitz baths for 20 minutes, 4 times per day, may help relieve pain.   Non-prescription pain killers may be used as your caregiver recommends if you have no allergies to them. Some illnesses or conditions prevent use of non-prescription drugs. If unsure, check with your caregiver. Take all medications as directed. Take the antibiotics for the prescribed length of time, even if you are feeling better.  SEEK MEDICAL CARE IF:   You have any worsening of the symptoms that originally brought you to your caregiver.   You have an oral temperature above 102 F (38.9 C).   You experience any side effects from medications prescribed.  SEEK IMMEDIATE MEDICAL CARE IF:   You have an oral temperature above 102 F (38.9 C), not controlled by medicine.   You have pain not relieved with medications.   You develop nausea, vomiting, lightheadedness, or have a fainting episode.   You are unable to urinate.   You pass bloody urine or clots.  Document Released: 03/03/2000 Document Revised: 11/16/2010 Document Reviewed: 09/22/2008 [facility] Patient Information 2012 [place], [place].

## 2011-05-12 ENCOUNTER — Encounter: Payer: Self-pay | Admitting: Family

## 2011-05-12 ENCOUNTER — Ambulatory Visit (INDEPENDENT_AMBULATORY_CARE_PROVIDER_SITE_OTHER): Payer: Managed Care, Other (non HMO) | Admitting: Family

## 2011-05-12 VITALS — BP 108/80 | Temp 98.7°F | Ht 71.0 in | Wt 200.0 lb

## 2011-05-12 DIAGNOSIS — R05 Cough: Secondary | ICD-10-CM

## 2011-05-12 DIAGNOSIS — Z23 Encounter for immunization: Secondary | ICD-10-CM

## 2011-05-12 DIAGNOSIS — J019 Acute sinusitis, unspecified: Secondary | ICD-10-CM

## 2011-05-12 MED ORDER — PREDNISONE 20 MG PO TABS: 40.0000 mg | ORAL_TABLET | Freq: Every day | ORAL | Status: AC

## 2011-05-12 MED ORDER — AZITHROMYCIN 250 MG PO TABS: ORAL_TABLET | ORAL | Status: AC

## 2011-05-12 NOTE — Patient Instructions (Signed)

## 2011-05-12 NOTE — Progress Notes (Signed)
Addended byAdline Mango B on: 05/12/2011 10:53 AM   Modules accepted: Orders

## 2011-05-12 NOTE — Progress Notes (Signed)
  Subjective:    Patient ID: Craig Conner, male    DOB: 05-05-1970, 41 y.o.   MRN: [id number]  Sinusitis This is a recurrent problem. The current episode started more than 1 month ago. The problem is unchanged. There has been no fever. Associated symptoms include congestion, coughing, sinus pressure and sneezing. Pertinent negatives include no sore throat or swollen glands. Past treatments include spray decongestants. The treatment provided no relief.   Patient is leaving for [place] for 2 weeks.    Review of Systems  Constitutional: Negative.   HENT: Positive for congestion, sneezing and sinus pressure. Negative for sore throat.   Eyes: Negative.   Respiratory: Positive for cough.   Cardiovascular: Negative.   Skin: Negative.   Neurological: Negative.   Hematological: Negative.   Psychiatric/Behavioral: Negative.        Objective:   Physical Exam  Constitutional: He is oriented to person, place, and time. He appears well-developed and well-nourished.  HENT:  Right Ear: External ear normal.  Left Ear: External ear normal.  Nose: Nose normal.  Mouth/Throat: Oropharynx is clear and moist.       Left maxillary sinus tenderness  Neck: Neck supple.  Cardiovascular: Normal rate, regular rhythm and normal heart sounds.   Pulmonary/Chest: Effort normal and breath sounds normal.  Neurological: He is alert and oriented to person, place, and time.  Skin: Skin is warm and dry.  Psychiatric: He has a normal mood and affect.          Assessment & Plan:  Assessment: Acute Sinusitis, Cough   Plan:Prednisone 20mg  2 po bid x 5 day. Zpak as directed. Call the office if symptoms worsen or persist. Recheck. PRN

## 2011-07-05 ENCOUNTER — Telehealth: Payer: Self-pay | Admitting: *Deleted

## 2011-07-05 DIAGNOSIS — K5289 Other specified noninfective gastroenteritis and colitis: Secondary | ICD-10-CM

## 2011-07-05 NOTE — Telephone Encounter (Signed)
Pt is calling to ask for a referral for a colonoscopy.

## 2011-08-03 ENCOUNTER — Encounter (INDEPENDENT_AMBULATORY_CARE_PROVIDER_SITE_OTHER): Payer: Managed Care, Other (non HMO) | Admitting: General Surgery

## 2011-08-18 ENCOUNTER — Other Ambulatory Visit: Payer: Managed Care, Other (non HMO)

## 2011-08-28 ENCOUNTER — Other Ambulatory Visit (INDEPENDENT_AMBULATORY_CARE_PROVIDER_SITE_OTHER): Payer: Managed Care, Other (non HMO)

## 2011-08-28 DIAGNOSIS — Z79899 Other long term (current) drug therapy: Secondary | ICD-10-CM

## 2011-08-28 DIAGNOSIS — Z0000 Encounter for general adult medical examination without abnormal findings: Secondary | ICD-10-CM

## 2011-08-28 LAB — BASIC METABOLIC PANEL
BUN: 12 mg/dL (ref 6–23)
Chloride: 102 mEq/L (ref 96–112)
Potassium: 3.9 mEq/L (ref 3.5–5.1)

## 2011-08-28 LAB — CBC WITH DIFFERENTIAL/PLATELET
Basophils Relative: 0.8 % (ref 0.0–3.0)
Eosinophils Relative: 4.8 % (ref 0.0–5.0)
HCT: 44.8 % (ref 39.0–52.0)
Hemoglobin: 15.1 g/dL (ref 13.0–17.0)
Lymphs Abs: 1.4 10*3/uL (ref 0.7–4.0)
MCV: 91.1 fl (ref 78.0–100.0)
Monocytes Absolute: 0.4 10*3/uL (ref 0.1–1.0)
Neutro Abs: 3 10*3/uL (ref 1.4–7.7)
Platelets: 124 10*3/uL — ABNORMAL LOW (ref 150.0–400.0)
RBC: 4.92 Mil/uL (ref 4.22–5.81)
WBC: 5.2 10*3/uL (ref 4.5–10.5)

## 2011-08-28 LAB — LIPID PANEL: Cholesterol: 155 mg/dL (ref 0–200)

## 2011-08-28 LAB — POCT URINALYSIS DIPSTICK
Bilirubin, UA: NEGATIVE
Blood, UA: NEGATIVE
Glucose, UA: NEGATIVE
Nitrite, UA: NEGATIVE
Spec Grav, UA: 1.01

## 2011-08-28 LAB — TSH: TSH: 0.35 u[IU]/mL (ref 0.35–5.50)

## 2011-08-28 LAB — HEPATIC FUNCTION PANEL
ALT: 23 U/L (ref 0–53)
Albumin: 4.4 g/dL (ref 3.5–5.2)
Total Protein: 6.9 g/dL (ref 6.0–8.3)

## 2011-08-29 ENCOUNTER — Encounter: Payer: Self-pay | Admitting: Family Medicine

## 2011-08-29 ENCOUNTER — Ambulatory Visit (INDEPENDENT_AMBULATORY_CARE_PROVIDER_SITE_OTHER): Payer: Managed Care, Other (non HMO) | Admitting: Family Medicine

## 2011-08-29 VITALS — BP 140/88 | Temp 97.7°F | Ht 71.0 in | Wt 196.0 lb

## 2011-08-29 DIAGNOSIS — Z0000 Encounter for general adult medical examination without abnormal findings: Secondary | ICD-10-CM

## 2011-08-29 DIAGNOSIS — G43909 Migraine, unspecified, not intractable, without status migrainosus: Secondary | ICD-10-CM

## 2011-08-29 DIAGNOSIS — J309 Allergic rhinitis, unspecified: Secondary | ICD-10-CM

## 2011-08-29 LAB — LDL CHOLESTEROL, DIRECT: Direct LDL: 78 mg/dL

## 2011-08-29 MED ORDER — FLUTICASONE PROPIONATE 50 MCG/ACT NA SUSP: 2.0000 | Freq: Every day | NASAL | Status: DC | PRN

## 2011-08-29 MED ORDER — SUMATRIPTAN SUCCINATE 100 MG PO TABS: 100.0000 mg | ORAL_TABLET | ORAL | Status: AC | PRN

## 2011-08-29 MED ORDER — METOCLOPRAMIDE HCL 10 MG PO TABS: 10.0000 mg | ORAL_TABLET | ORAL | Status: DC | PRN

## 2011-08-29 MED ORDER — IMIPRAMINE HCL 25 MG PO TABS
25.0000 mg | ORAL_TABLET | Freq: Every day | ORAL | Status: DC
Start: 2011-08-29 — End: 2016-05-17

## 2011-08-29 NOTE — Patient Instructions (Signed)
Continue your current medication  Remember to do a thorough skin exam monthly and wear sunscreens SPF 50+  Return in one year for general physical exam sooner if any problems

## 2011-08-29 NOTE — Progress Notes (Signed)
  Subjective:    Patient ID: Craig Conner, male    DOB: Sep 09, 1970, 41 y.o.   MRN: [id number]  HPI Keeghan is a 41 year old married male nonsmoker who comes in today for general physical examination  He has a history of allergic rhinitis he uses a steroid nasal spray when necessary  He has a history of migraine headaches he takes Tofranil 25 mg each bedtime Reglan when necessary and Imitrex when necessary. [facility] they're just refill his meds and wants to know for can not refill them here for him  He gets routine eye care, dental care, colonoscopy not until age 88 negative family history of colon cancer and polyps. He recently saw a general surgeon about some rectal bleeding it was a fissure. 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   HENT: Negative.   Eyes: Negative.   Respiratory: Negative.   Cardiovascular: Negative.   Gastrointestinal: Negative.   Genitourinary: Negative.   Musculoskeletal: Negative.   Skin: Negative.   Neurological: Negative.   Hematological: Negative.   Psychiatric/Behavioral: Negative.        Objective:   Physical Exam  Constitutional: He is oriented to person, place, and time. He appears well-developed and well-nourished.  HENT:  Head: Normocephalic and atraumatic.  Right Ear: External ear normal.  Left Ear: External ear normal.  Nose: Nose normal.  Mouth/Throat: Oropharynx is clear and moist.  Eyes: Conjunctivae and EOM are normal. Pupils are equal, round, and reactive to light.  Neck: Normal range of motion. Neck supple. No JVD present. No tracheal deviation present. No thyromegaly present.  Cardiovascular: Normal rate, regular rhythm, normal heart sounds and intact distal pulses.  Exam reveals no gallop and no friction rub.   No murmur heard. Pulmonary/Chest: Effort  normal and breath sounds normal. No stridor. No respiratory distress. He has no wheezes. He has no rales. He exhibits no tenderness.  Abdominal: Soft. Bowel sounds are normal. He exhibits no distension and no mass. There is no tenderness. There is no rebound and no guarding.  Genitourinary: Penis normal. No penile tenderness.  Musculoskeletal: Normal range of motion. He exhibits no edema and no tenderness.  Lymphadenopathy:    He has no cervical adenopathy.  Neurological: He is alert and oriented to person, place, and time. He has normal reflexes. No cranial nerve deficit. He exhibits normal muscle tone.  Skin: Skin is warm and dry. No rash noted. No erythema. No pallor.       Total body skin exam normal  Psychiatric: He has a normal mood and affect. His behavior is normal. Judgment and thought content normal.          Assessment & Plan:  Healthy male  Allergic rhinitis continue steroid nasal spray  Migraine headaches continue Tofranil Reglan and Imitrex  History of skin cancer meticulous sunscreens etc.

## 2011-09-05 ENCOUNTER — Telehealth: Payer: Self-pay | Admitting: Family Medicine

## 2011-09-05 NOTE — Telephone Encounter (Signed)
[facility] Triage Call Report Triage Record Num: [id number] Operator: Albertine Grates Patient Name: Craig Conner Call Date & Time: 09/04/2011 9:48:17PM Patient Phone: [phone] PCP: Eugenio Hoes. Todd Patient Gender: Male PCP Fax : [phone] Patient DOB: [birth year] [facility] Name: [facility] Reason for Call: Caller: Stacey/Spouse; PCP: Roderick Pee.; CB#: [phone]; Call regarding Migraine, pain in back and legs; Has had headache since 6-15. Has taken Sumitriptan as directed. Has developed fever, back pain 6-17. Temp is 102.8 scanned on forehead. Has burning when urinating. Has been urinating more frequently. Advised ED. Protocol(s) Used: Urinary Symptoms - Male Recommended Outcome per Protocol: See Provider within 4 hours Reason for Outcome: Urinary tract symptoms AND fever 101.5 F (38.6 C) or higher or vomiting Care Advice: ~ 06/

## 2011-09-11 ENCOUNTER — Telehealth: Payer: Self-pay | Admitting: Family Medicine

## 2011-09-11 DIAGNOSIS — G43909 Migraine, unspecified, not intractable, without status migrainosus: Secondary | ICD-10-CM

## 2011-09-11 NOTE — Telephone Encounter (Signed)
Dr. love is retiring

## 2011-09-11 NOTE — Telephone Encounter (Signed)
Patient called stating that he was given a name of a different neurologist that may be able to help him with his migraines and would like to be referred to Dr. Sandria Manly. Please advise.

## 2011-09-12 NOTE — Telephone Encounter (Signed)
Referral request sent 

## 2011-09-18 ENCOUNTER — Encounter: Payer: Self-pay | Admitting: Family Medicine

## 2011-09-29 ENCOUNTER — Encounter (INDEPENDENT_AMBULATORY_CARE_PROVIDER_SITE_OTHER): Payer: Self-pay | Admitting: General Surgery

## 2011-09-29 ENCOUNTER — Ambulatory Visit (INDEPENDENT_AMBULATORY_CARE_PROVIDER_SITE_OTHER): Payer: Managed Care, Other (non HMO) | Admitting: General Surgery

## 2011-09-29 VITALS — BP 120/82 | HR 94 | Temp 99.0°F | Ht 71.0 in | Wt 195.2 lb

## 2011-09-29 DIAGNOSIS — K602 Anal fissure, unspecified: Secondary | ICD-10-CM

## 2011-09-29 DIAGNOSIS — D179 Benign lipomatous neoplasm, unspecified: Secondary | ICD-10-CM

## 2011-09-29 NOTE — Progress Notes (Signed)
Chief complaint: #1 multiple lipomas #2 anal fissure  History: Patient returned to the office. He has a history of multiple lipomas removed in the past. He has developed 3 areas that have become symptomatic. He has 2 soft tissue nodules on the volar aspect of his right forearm that caused discomfort as he has to rest his arm on a desk. Also upper left buttock where he sits and causing pressure and discomfort.  Patient has a history of recurrent anal fissure with remote sphincterotomy. He feels his symptoms are well-controlled with intermittent diltiazem which would prefer to continue for now and requests a refill.  Exam: Gen.: Healthy-appearing Caucasian male Extremities: Burt exam Limited here. On the volar aspect of the proximal right forearm one near the elbow and another 5 or 6 cm more distal part to 1 cm freely movable subcutaneous soft nodules consistent with lipomas. On the upper left buttock is an approximately 2.5 cm distinct fleshy subcutaneous mass consistent with lipoma.  Assessment plan: #1, multiple symptomatic subcutaneous masses probable lipomas. Will plan excision of these 3 areas under local anesthesia as an outpatient. #2 recurrent anal fissure. I refilled his diltiazem cream. He will let me know if this does not adequately control his symptoms and we have previously discussed repeat sphincterotomy.

## 2011-10-12 ENCOUNTER — Telehealth: Payer: Self-pay | Admitting: Family Medicine

## 2011-10-12 NOTE — Telephone Encounter (Signed)
Forward to rachel - I dont see pulse recorded in office visit notes - checked through Nov 2012

## 2011-10-12 NOTE — Telephone Encounter (Signed)
Caller: Jabre/Patient; PCP: Roderick Pee.; CB#: [phone]; ; ; Call regarding Wanting To Know His Normal Pulse Rate;  Was seen in the office about 2 months ago for CPE.  Asking what his normal heart rate has been in the past.  Has seen urologist and feels pulse at that visit may have been higher than usual. Checked EPIC - pulse not recorded in recent visits to clinic.

## 2011-10-16 NOTE — Telephone Encounter (Signed)
Left message on machine for patient that a normal pulse can be 60-100.

## 2011-10-27 ENCOUNTER — Ambulatory Visit (INDEPENDENT_AMBULATORY_CARE_PROVIDER_SITE_OTHER): Payer: Managed Care, Other (non HMO) | Admitting: Family Medicine

## 2011-10-27 VITALS — BP 122/84 | HR 82 | Temp 98.6°F | Resp 16 | Ht 70.0 in | Wt 191.8 lb

## 2011-10-27 DIAGNOSIS — S0180XA Unspecified open wound of other part of head, initial encounter: Secondary | ICD-10-CM

## 2011-10-27 DIAGNOSIS — S0181XA Laceration without foreign body of other part of head, initial encounter: Secondary | ICD-10-CM

## 2011-10-27 DIAGNOSIS — G501 Atypical facial pain: Secondary | ICD-10-CM

## 2011-10-27 NOTE — Progress Notes (Signed)
Is a 41 year old man who was working in his yard and pull the plug out of the outlet for the appliance he was using and the plug hit him in the chin just below the vermilion border of the central part of his chin. Get blood for quite a while and is only stopped since he arrived here. His last tetanus shot was 2-3 years ago  Objective: Vertical 5 mm laceration which is well approximated.  Teeth are normal  The laceration was closed with Dermabond  Assessment: Minor laceration with low risk of scarring or infection

## 2011-12-15 ENCOUNTER — Other Ambulatory Visit (INDEPENDENT_AMBULATORY_CARE_PROVIDER_SITE_OTHER): Payer: Self-pay | Admitting: General Surgery

## 2011-12-15 DIAGNOSIS — D1739 Benign lipomatous neoplasm of skin and subcutaneous tissue of other sites: Secondary | ICD-10-CM

## 2011-12-22 ENCOUNTER — Telehealth (INDEPENDENT_AMBULATORY_CARE_PROVIDER_SITE_OTHER): Payer: Self-pay | Admitting: General Surgery

## 2011-12-22 NOTE — Telephone Encounter (Signed)
Called and left a message regarding path report

## 2012-02-12 ENCOUNTER — Telehealth: Payer: Self-pay | Admitting: Family Medicine

## 2012-02-12 NOTE — Telephone Encounter (Signed)
Left message on machine returning patient's call 

## 2012-02-12 NOTE — Telephone Encounter (Signed)
Patient Information:  Caller Name: Diem  Phone: [phone]  Patient: Craig Conner, Craig Conner  Gender: Male  DOB: [birth year]  Age: 41 Years  PCP: [facility])   Symptoms  Reason For Call & Symptoms: Had cold sxs 3 weeks ago, followed by allergies treated w/ Flonase and Zyrtec. Nasal congestion; right side of face w/ pressure and discomfort. Increased nose bleeds; nasal drnge from yellow - clear.  Reviewed Health History In EMR: Yes  Reviewed Medications In EMR: Yes  Reviewed Allergies In EMR: Yes  Date of Onset of Symptoms: Unknown  Treatments Tried: Flonase and Zyrtec  Treatments Tried Worked: No  Guideline(s) Used:  Sinus Pain and Congestion  Disposition Per Guideline:   Go to Office Now  Reason For Disposition Reached:   Severe sinus pain  Advice Given:  For a Stuffy Nose - Use Nasal Washes:  Introduction  Nasal Decongestants for a Very Stuffy or Runny Nose:  If you have a very stuffy nose, nasal decongestant medicines can shrink the swollen nasal mucosa and allow for easier breathing. If you have a very runny nose, these medicines can reduce the amount of drainage. They may be taken as pills by mouth or as a nasal spray.  Hydration:  Drink plenty of liquids (6-8 glasses of water daily). If the air in your home is dry, use a cool mist humidifier  Call Back If:   You become worse.  Office Follow Up:  Does the office need to follow up with this patient?: No  Instructions For The Office: N/A  RN Overrode Recommendation:  Make Appointment  Pt. declined offer of UC tonight but request appt for tomorrow 02/13/12,.  Appointment Scheduled:  02/13/2012 15:15:00

## 2012-02-12 NOTE — Telephone Encounter (Signed)
Pt called and has a sinus inf. Req work a work in [employer] with Dr Tawanna Cooler today or call in med to  [facility] [address]

## 2012-02-13 ENCOUNTER — Encounter: Payer: Self-pay | Admitting: Family Medicine

## 2012-02-13 ENCOUNTER — Ambulatory Visit (INDEPENDENT_AMBULATORY_CARE_PROVIDER_SITE_OTHER): Payer: Managed Care, Other (non HMO) | Admitting: Family Medicine

## 2012-02-13 VITALS — BP 120/84 | Temp 97.9°F | Wt 204.0 lb

## 2012-02-13 DIAGNOSIS — J309 Allergic rhinitis, unspecified: Secondary | ICD-10-CM

## 2012-02-13 MED ORDER — PREDNISONE 20 MG PO TABS: ORAL_TABLET | ORAL | Status: DC

## 2012-02-13 MED ORDER — AMOXICILLIN 875 MG PO TABS: 875.0000 mg | ORAL_TABLET | Freq: Two times a day (BID) | ORAL | Status: DC

## 2012-02-13 NOTE — Patient Instructions (Signed)
At bedtime due to nasal irrigation,,,,,,,,,,,, Afrin,,,,,,,, steroid nasal spray,,,,,,,,,,,,,, saline irrigation  Start the prednisone as directed  If in 4-5 days she does any improvement then take the amoxicillin

## 2012-02-13 NOTE — Progress Notes (Signed)
  Subjective:    Patient ID: Craig Conner, male    DOB: Apr 26, 1970, 41 y.o.   MRN: [id number]  HPI Craig Conner is a 41 year old male married nonsmoker who comes in today for evaluation of head congestion for 3 weeks  He has a long history of allergic rhinitis  3 years ago he was referred to [facility] ear nose and throat. He states they did not find anything surgically they could do to stop his recurrent infections.  His symptoms now are head congestion postnasal drip and some discomfort in his right maxillary sinus. No fever chills no wheezing.   Review of Systems    general an immunologic review of systems otherwise negative Objective:   Physical Exam  Well-developed well-nourished male in no acute distress HEENT negative except for 4+ nasal edema septum in the midline      Assessment & Plan:  Allergic rhinitis plan treat symptomatically if after 4-5 days we don't see any improvement then we'll take a round of antibiotics

## 2012-02-19 ENCOUNTER — Telehealth: Payer: Self-pay | Admitting: *Deleted

## 2012-02-19 NOTE — Telephone Encounter (Signed)
Patient is calling back with an update about his sinus infection.  Patient states that the prednisone is helping with his allergies.  He started the Amoxicillin on Saturday for his sinus pressure.  He states that he is still having pressure on the right side of his face.  He would like to know if he should try something different?

## 2012-02-20 NOTE — Telephone Encounter (Signed)
Left message on machine for patient

## 2012-02-20 NOTE — Telephone Encounter (Signed)
Fleet Contras please call the antibiotics take 4-5 days to begin the work

## 2012-03-04 ENCOUNTER — Other Ambulatory Visit: Payer: Self-pay | Admitting: Family Medicine

## 2012-03-04 ENCOUNTER — Telehealth: Payer: Self-pay | Admitting: Family Medicine

## 2012-03-04 NOTE — Telephone Encounter (Signed)
Pt states antibiotics is not working. It has been 13 days. Would like to switch to something else. [facility]

## 2012-03-04 NOTE — Telephone Encounter (Signed)
Per Dr Tawanna Cooler  1. Sinus x-rays or 2. Follow up with ENT Left message on machine for patient.  Patient should call back if he wants xray of sinus.

## 2012-03-04 NOTE — Telephone Encounter (Signed)
Patient Information:  Caller Name: Leeon  Phone: [phone]  Patient: Craig Conner, Craig Conner  Gender: Male  DOB: 08-24-1970  Age: 41 Years  PCP: Kelle Darting [facility])  Office Follow Up:  Does the office need to follow up with this patient?: Yes  Instructions For The Office: Pt requesting another antibiotic be called in.   Symptoms  Reason For Call & Symptoms: Having bad H/A's since 02/29/12.  Has been taking Amoxicillin since 04/21/11. He is using Flonase, but not helping.  It is causing more frequent migraines and he had one 03/03/12 PM.  Called [facility] and no new medication has been call in yet.  Dr. Tawanna Cooler is booked 100% 03/05/12, so pt instructed to call back for possible work in.   Reviewed Health History In EMR: Yes  Reviewed Medications In EMR: Yes  Reviewed Allergies In EMR: Yes  Reviewed Surgeries / Procedures: Yes  Date of Onset of Symptoms: 02/29/2012  Treatments Tried: Takign Amoxicillin and Flonase and head pressure is still there.  Treatments Tried Worked: No  Guideline(s) Used:  Sinus Pain and Congestion  Disposition Per Guideline:   See Today or Tomorrow in Office  Reason For Disposition Reached:   Sinus congestion (pressure, fullness) present > 10 days  Advice Given:  For a Stuffy Nose - Use Nasal Washes:  Introduction: Saline (salt water) nasal irrigation (nasal wash) is an effective and simple home remedy for treating stuffy nose and sinus congestion. The nose can be irrigated by pouring, spraying, or squirting salt water into the nose and then letting it run back out.  How it Helps: The salt water rinses out excess mucus, washes out any irritants (dust, allergens) that might be present, and moistens the nasal cavity.  Methods: There are several ways to perform nasal irrigation. You can use a saline nasal spray bottle (available over-the-counter), a rubber ear syringe, a medical syringe without the needle, or a Neti Pot.

## 2012-03-06 NOTE — Telephone Encounter (Signed)
See earlier note.  Left message on machine for patient to go to EMT or have sinus xray done. Patient to call back.

## 2012-05-01 ENCOUNTER — Other Ambulatory Visit: Payer: Self-pay | Admitting: Otolaryngology

## 2012-05-01 DIAGNOSIS — J324 Chronic pansinusitis: Secondary | ICD-10-CM

## 2012-05-06 ENCOUNTER — Ambulatory Visit
Admission: RE | Admit: 2012-05-06 | Discharge: 2012-05-06 | Disposition: A | Discharge status: Home / Self Care | Payer: BC Managed Care – PPO | Source: Ambulatory Visit | Attending: Otolaryngology | Admitting: Otolaryngology

## 2012-05-06 DIAGNOSIS — J324 Chronic pansinusitis: Secondary | ICD-10-CM

## 2012-08-08 ENCOUNTER — Other Ambulatory Visit (INDEPENDENT_AMBULATORY_CARE_PROVIDER_SITE_OTHER): Payer: Self-pay

## 2012-08-08 DIAGNOSIS — K602 Anal fissure, unspecified: Secondary | ICD-10-CM

## 2012-08-08 MED ORDER — DILTIAZEM GEL 2 %: 1.0000 "application " | Freq: Four times a day (QID) | CUTANEOUS | Status: DC

## 2012-08-08 NOTE — Progress Notes (Signed)
Refill request for Diltiazem 2% Gel has been approved by Dr. Johna Sheriff and faxed to [facility] @ [phone].  Fax confirmation rec'd and attached to outgoing fax.

## 2012-12-12 IMAGING — CT CT ANGIO CHEST
1 of 3 series · 18 of 32 positions shown · IV contrast (APPLIED)
Comparison: 12/18/2010

CLINICAL DATA: Shortness of breath.  Recent air travel.

CT ANGIOGRAPHY CHEST WITH CONTRAST
TECHNIQUE: Multidetector CT imaging of the chest was performed
using the standard protocol during bolus administration of
intravenous contrast.  Multiplanar CT image reconstructions
including MIPs were obtained to evaluate the vascular anatomy.
Contrast:  80 ml 1mnipaque-RNN

[Series 5: pe thins @ 1mm · axial · 0.70mm/px · z∈[-104,+151]mm · 18 of 285 slices shown]
[im 15/285  lung]
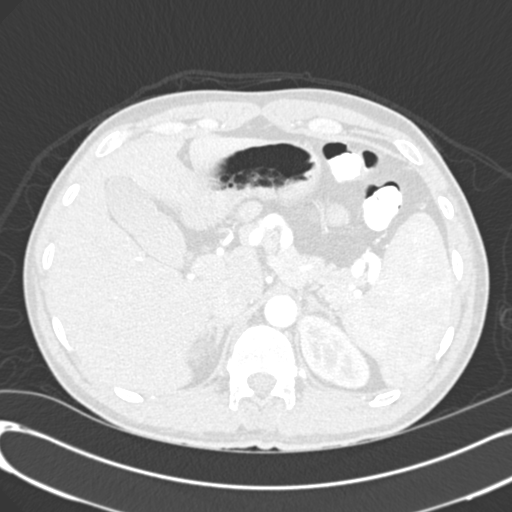
[im 30/285  soft-tissue]
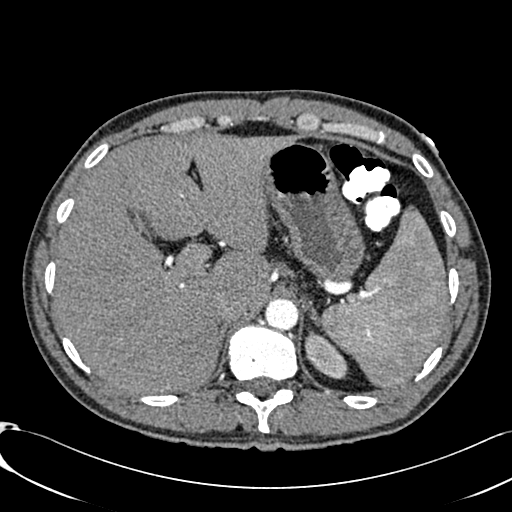
[im 45/285  lung]
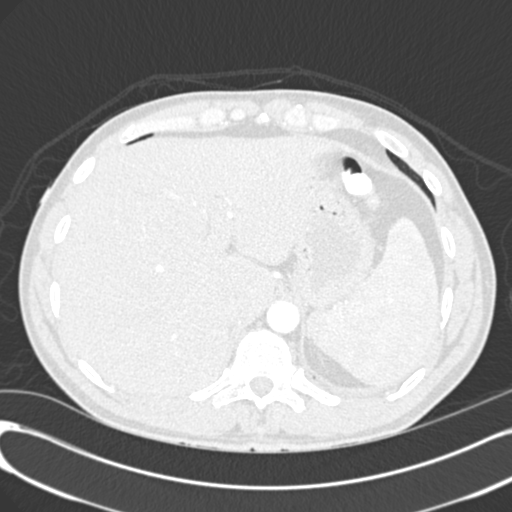
[im 60/285  soft-tissue]
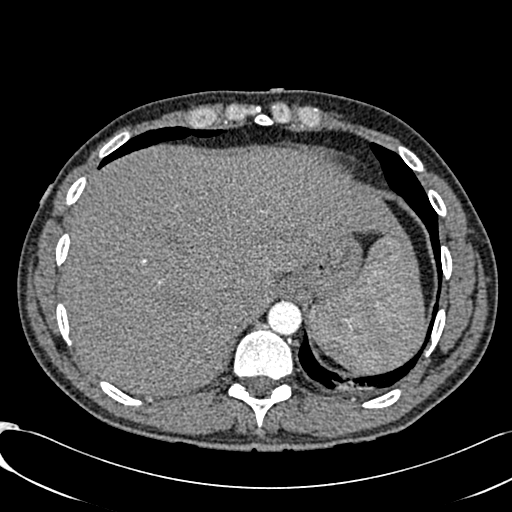
[im 75/285  lung]
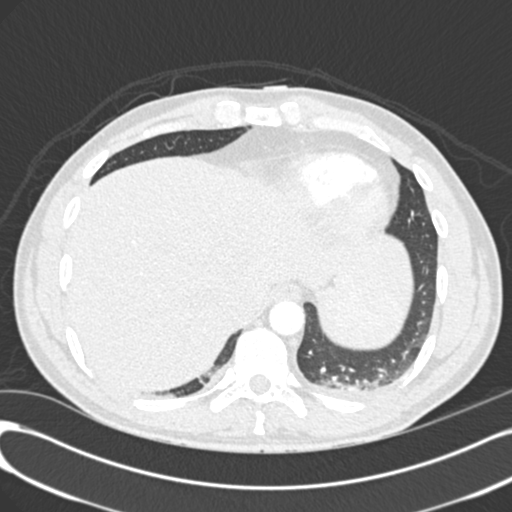
[im 90/285  soft-tissue]
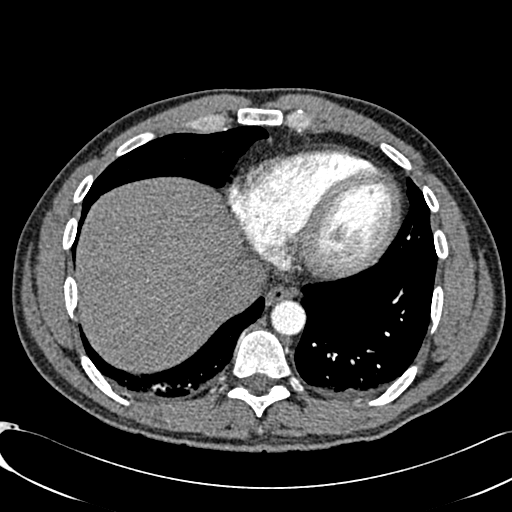
[im 105/285  lung]
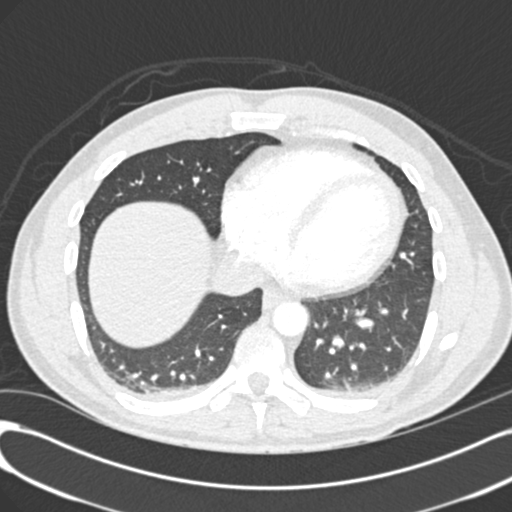
[im 120/285  soft-tissue]
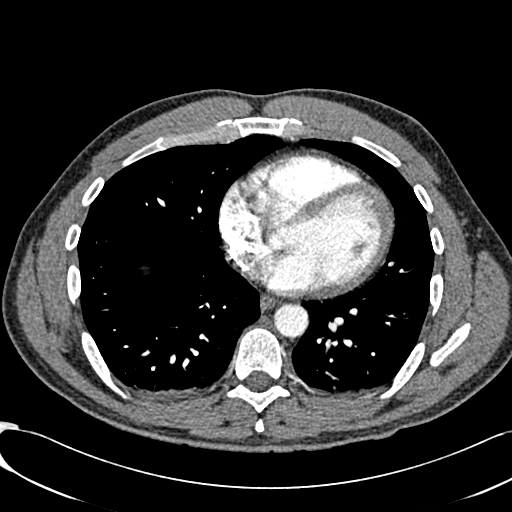
[im 135/285  lung]
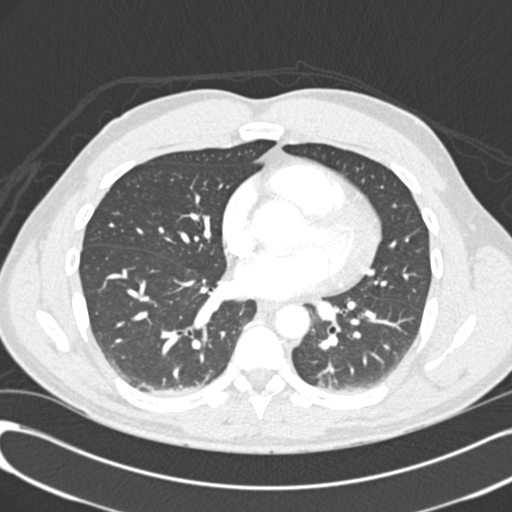
[im 150/285  soft-tissue]
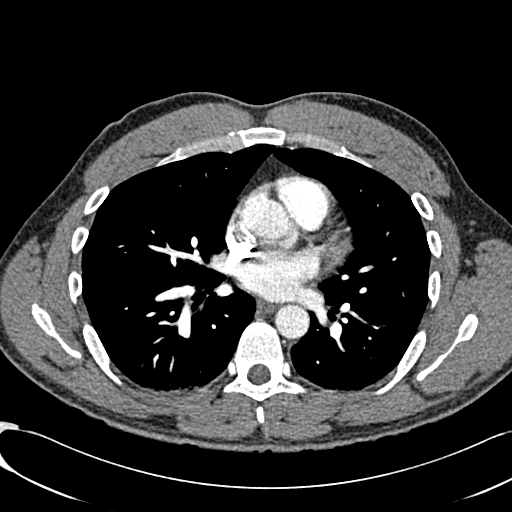
[im 165/285  lung]
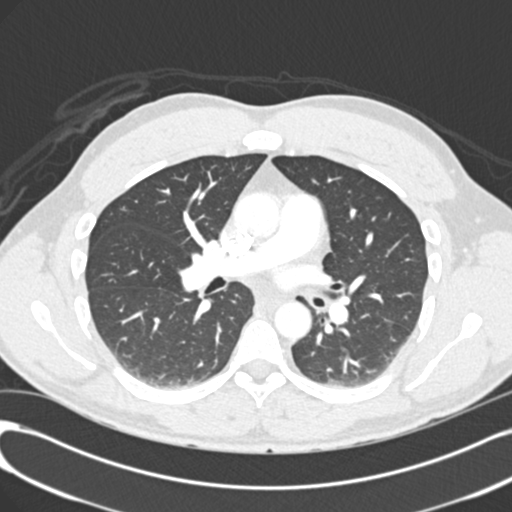
[im 180/285  soft-tissue]
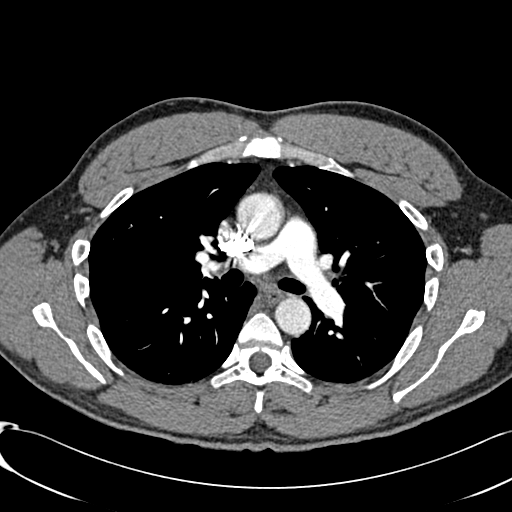
[im 195/285  lung]
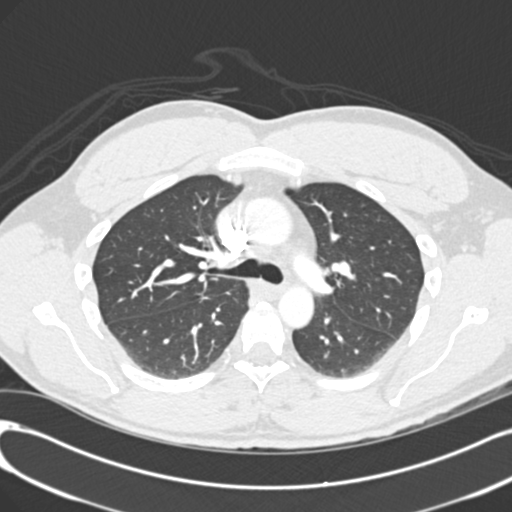
[im 210/285  soft-tissue]
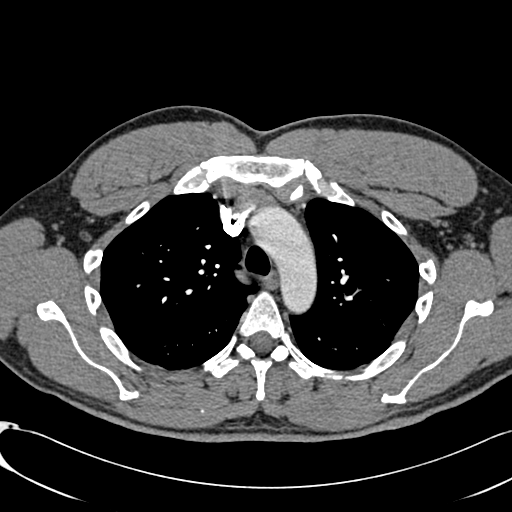
[im 225/285  lung]
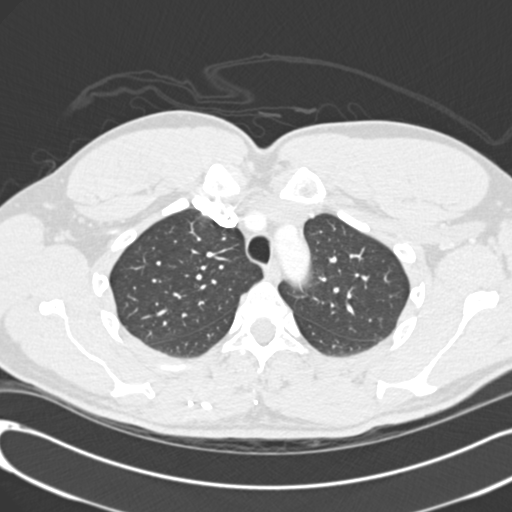
[im 240/285  soft-tissue]
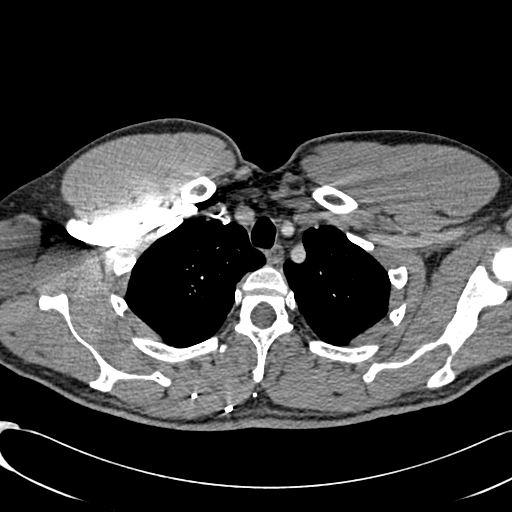
[im 255/285  lung]
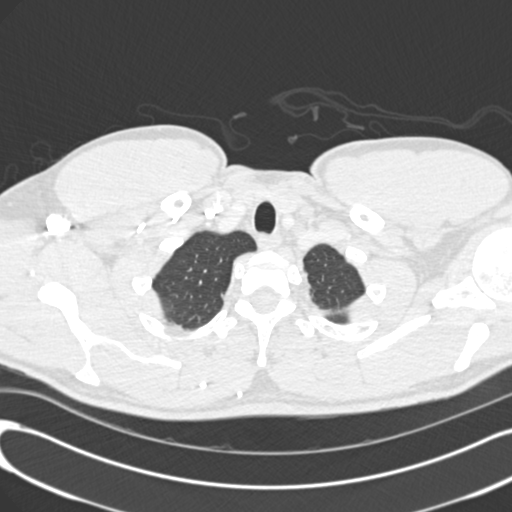
[im 270/285  soft-tissue]
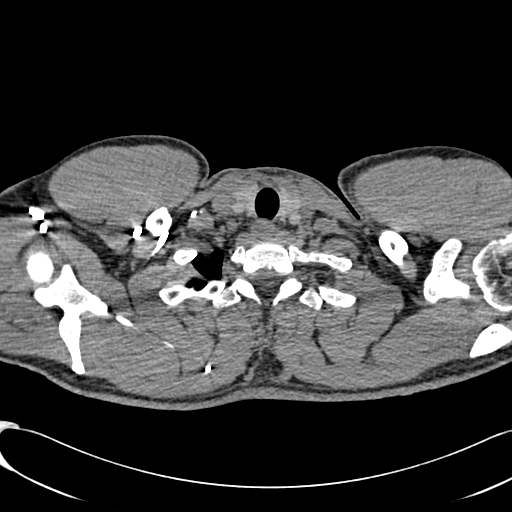

[18 of 32 positions shown; findings below may reference images not displayed]

FINDINGS: No filling defect is identified in the pulmonary
arterial tree to suggest pulmonary embolus.

No aortic dissection is observed.  No pathologic thoracic
adenopathy is identified.

No pericardial effusion noted.  Trace bilateral pleural effusions
are present.

There is dependent subsegmental atelectasis in both lower lobes.

Review of the MIP images confirms the above findings.
IMPRESSION: 1. No filling defect is identified in the pulmonary arterial tree
to suggest pulmonary embolus.
2.  Trace bilateral pleural effusions with dependent subsegmental
atelectasis.

## 2013-02-12 ENCOUNTER — Other Ambulatory Visit: Payer: Self-pay | Admitting: Orthopedic Surgery

## 2013-02-12 DIAGNOSIS — M25512 Pain in left shoulder: Secondary | ICD-10-CM

## 2013-02-21 ENCOUNTER — Ambulatory Visit
Admission: RE | Admit: 2013-02-21 | Discharge: 2013-02-21 | Disposition: A | Discharge status: Home / Self Care | Payer: BC Managed Care – PPO | Source: Ambulatory Visit | Attending: Orthopedic Surgery | Admitting: Orthopedic Surgery

## 2013-02-21 DIAGNOSIS — M25512 Pain in left shoulder: Secondary | ICD-10-CM

## 2014-05-21 ENCOUNTER — Encounter: Payer: Self-pay | Admitting: Family Medicine

## 2014-05-21 ENCOUNTER — Ambulatory Visit (INDEPENDENT_AMBULATORY_CARE_PROVIDER_SITE_OTHER): Payer: BLUE CROSS/BLUE SHIELD | Admitting: Family Medicine

## 2014-05-21 VITALS — BP 120/80 | Temp 98.1°F | Wt 194.0 lb

## 2014-05-21 DIAGNOSIS — M542 Cervicalgia: Secondary | ICD-10-CM | POA: Insufficient documentation

## 2014-05-21 NOTE — Progress Notes (Signed)
Pre visit review using our clinic review tool, if applicable. No additional management support is needed unless otherwise documented below in the visit note. 
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
## 2016-05-17 ENCOUNTER — Encounter: Payer: Self-pay | Admitting: Family Medicine

## 2016-05-17 ENCOUNTER — Ambulatory Visit (INDEPENDENT_AMBULATORY_CARE_PROVIDER_SITE_OTHER): Payer: BLUE CROSS/BLUE SHIELD | Admitting: Family Medicine

## 2016-05-17 VITALS — BP 110/80 | HR 102 | Temp 97.9°F | Ht 70.0 in | Wt 176.0 lb

## 2016-05-17 DIAGNOSIS — N410 Acute prostatitis: Secondary | ICD-10-CM | POA: Diagnosis not present

## 2016-05-17 DIAGNOSIS — R350 Frequency of micturition: Secondary | ICD-10-CM

## 2016-05-17 LAB — POCT URINALYSIS DIPSTICK
BILIRUBIN UA: NEGATIVE
Glucose, UA: NEGATIVE
Ketones, UA: NEGATIVE
NITRITE UA: NEGATIVE
PH UA: 7
PROTEIN UA: NEGATIVE
Spec Grav, UA: <=1.005
Urobilinogen, UA: 0.2

## 2016-05-17 MED ORDER — CIPROFLOXACIN HCL 500 MG PO TABS: 500.0000 mg | ORAL_TABLET | Freq: Two times a day (BID) | ORAL | 0 refills | Status: DC

## 2016-05-17 NOTE — Patient Instructions (Signed)
Prostatitis Prostatitis is swelling or inflammation of the prostate gland. The prostate is a walnut-sized gland that is involved in the production of semen. It is located below a man's bladder, in front of the rectum. There are four types of prostatitis:  Chronic nonbacterial prostatitis. This is the most common type of prostatitis. It may be associated with a viral infection or autoimmune disorder.  Acute bacterial prostatitis. This is the least common type of prostatitis. It starts quickly and is usually associated with a bladder infection, high fever, and shaking chills. It can occur at any age.  Chronic bacterial prostatitis. This type usually results from acute bacterial prostatitis that happens repeatedly (is recurrent) or has not been treated properly. It can occur in men of any age but is most common among middle-aged men whose prostate has begun to get larger. The symptoms are not as severe as symptoms caused by acute bacterial prostatitis.  Prostatodynia or chronic pelvic pain syndrome (CPPS). This type is also called pelvic floor disorder. It is associated with increased muscular tone in the pelvis surrounding the prostate. What are the causes? Bacterial prostatitis is caused by infection from bacteria. Chronic nonbacterial prostatitis may be caused by:  Urinary tract infections (UTIs).  Nerve damage.  A response by the body's disease-fighting system (autoimmune response).  Chemicals in the urine. The causes of the other types of prostatitis are usually not known. What are the signs or symptoms? Symptoms of this condition vary depending upon the type of prostatitis. If you have acute bacterial prostatitis, you may experience:  Urinary symptoms, such as:  Painful urination.  Burning during urination.  Frequent and sudden urges to urinate.  Inability to start urinating.  A weak or interrupted stream of urine.  Vomiting.  Nausea.  Fever.  Chills.  Inability to  empty the bladder completely.  Pain in the:  Muscles or joints.  Lower back.  Lower abdomen. If you have any of the other types of prostatitis, you may experience:  Urinary symptoms, such as:  Sudden urges to urinate.  Frequent urination.  Difficulty starting urination.  Weak urine stream.  Dribbling after urination.  Discharge from the urethra. The urethra is a tube that opens at the end of the penis.  Pain in the:  Testicles.  Penis or tip of the penis.  Rectum.  Area in front of the rectum and below the scrotum (perineum).  Problems with sexual function.  Painful ejaculation.  Bloody semen. How is this diagnosed? This condition may be diagnosed based on:  A physical and medical exam.  Your symptoms.  A urine test to check for bacteria.  An exam in which a health care provider uses a finger to feel the prostate (digital rectal exam).  A test of a sample of semen.  Blood tests.  Ultrasound.  Removal of prostate tissue to be examined under a microscope (biopsy).  Tests to check how your body handles urine (urodynamic tests).  A test to look inside your bladder or urethra (cystoscopy). How is this treated? Treatment for this condition depends on the type of prostatitis. Treatment may involve:  Medicines to relieve pain or inflammation.  Medicines to help relax your muscles.  Physical therapy.  Heat therapy.  Techniques to help you control certain body functions (biofeedback).  Relaxation exercises.  Antibiotic medicine, if your condition is caused by bacteria.  Warm water baths (sitz baths). Sitz baths help with relaxing your pelvic floor muscles, which helps to relieve pressure on the prostate. Follow  these instructions at home:  Take over-the-counter and prescription medicines only as told by your health care provider.  If you were prescribed an antibiotic, take it as told by your health care provider. Do not stop taking the  antibiotic even if you start to feel better.  If physical therapy, biofeedback, or relaxation exercises were prescribed, do exercises as instructed.  Take sitz baths as directed by your health care provider. For a sitz bath, sit in warm water that is deep enough to cover your hips and buttocks.  Keep all follow-up visits as told by your health care provider. This is important. Contact a health care provider if:  Your symptoms get worse.  You have a fever. Get help right away if:  You have chills.  You feel nauseous.  You vomit.  You feel light-headed or feel like you are going to faint.  You are unable to urinate.  You have blood or blood clots in your urine. This information is not intended to replace advice given to you by your health care provider. Make sure you discuss any questions you have with your health care provider. Document Released: 03/03/2000 Document Revised: 11/25/2015 Document Reviewed: 11/25/2015 Elsevier Interactive Patient Education  2017 [employer].

## 2016-05-17 NOTE — Progress Notes (Signed)
Subjective:     Patient ID: Craig Conner, male   DOB: 1970-08-18, 46 y.o.   MRN: [id number]  HPI Patient seen with processing one-week history of some urine frequency. This past Sunday noticed some mild burning with urination. He had similar symptoms with prostatitis about 4 years ago which was treated by urology. He's had some chills and question of subjective fever but no documented fever past few days. He's had some achiness in the testicles and this is exactly the way he presented previously with prostatitis. No penile discharge. He is monogamous. No history of STDs. He's also had some associated left lower lumbar back pain which is not exacerbated by movement. Denies any radiculopathy features. No numbness or weakness lower extremities.  Past Medical History:  Diagnosis Date  . Anal fissure   . Arthritis   . Blood in stool   . Cancer ([place])    skin  . Migraine headache   . Nasal congestion    Past Surgical History:  Procedure Laterality Date  . ANAL FISSURE REPAIR  2002  . LIPOMA EXCISION  2002, 2003  . PILONIDAL CYST EXCISION  2002  . SKIN CANCER EXCISION  01/2010    reports that he has never smoked. He has never used smokeless tobacco. He reports that he drinks alcohol. He reports that he does not use drugs. family history includes Heart disease in his father; Migraines in his other. No Known Allergies   Review of Systems  Constitutional: Positive for chills.  Gastrointestinal: Negative for abdominal pain.  Genitourinary: Positive for dysuria, frequency and testicular pain. Negative for difficulty urinating, discharge, flank pain, hematuria and penile swelling.       Objective:   Physical Exam  Constitutional: He appears well-developed and well-nourished.  Cardiovascular: Normal rate and regular rhythm.   Pulmonary/Chest: Effort normal and breath sounds normal. No respiratory distress. He has no wheezes. He has no rales.  Genitourinary:  Genitourinary Comments:  Prostate is moderately tender to palpation but symmetric with no nodules       Assessment:     Probable acute prostatitis. His urine dipstick reveals 1+ leukocytes and 3+ blood with negative nitrates    Plan:     -Urine culture sent -Start Cipro 500 mg twice a day for 14 days -Stay well-hydrated -Warm sitz baths -Consider urology referral not better in 2 weeks  Eulas Post MD [facility] at [facility]

## 2016-05-17 NOTE — Progress Notes (Signed)
Pre visit review using our clinic review tool, if applicable. No additional management support is needed unless otherwise documented below in the visit note. 

## 2016-05-19 LAB — URINE CULTURE

## 2016-05-24 ENCOUNTER — Telehealth: Payer: Self-pay | Admitting: Family Medicine

## 2016-05-24 NOTE — Telephone Encounter (Signed)
Pt returning your call concerning lab results

## 2016-05-25 NOTE — Telephone Encounter (Signed)
Patient is aware of lab results.

## 2019-06-06 ENCOUNTER — Ambulatory Visit: Payer: Self-pay | Attending: Internal Medicine

## 2019-06-06 DIAGNOSIS — Z23 Encounter for immunization: Secondary | ICD-10-CM

## 2019-06-06 NOTE — Progress Notes (Signed)
   Covid-19 Vaccination Clinic  Name:  Craig Conner    MRN: [id number] DOB: [birth year]  06/06/2019  [name] was observed post Covid-19 immunization for 15 minutes without incident. He was provided with Vaccine Information Sheet and instruction to access the V-Safe system.   [name] was instructed to call 911 with any severe reactions post vaccine: Marland Kitchen Difficulty breathing  . Swelling of face and throat  . A fast heartbeat  . A bad rash all over body  . Dizziness and weakness   Immunizations Administered    Name Date Dose VIS Date Route   Pfizer COVID-19 Vaccine 06/06/2019  8:29 AM 0.3 mL 02/28/2019 Intramuscular   Manufacturer: [place]   Lot: [id number]   [place]: [id number]

## 2019-07-01 ENCOUNTER — Ambulatory Visit: Payer: Self-pay | Attending: Internal Medicine

## 2019-07-01 DIAGNOSIS — Z23 Encounter for immunization: Secondary | ICD-10-CM

## 2019-07-01 NOTE — Progress Notes (Signed)
   Covid-19 Vaccination Clinic  Name:  Craig Conner    MRN: [id number] DOB: 09-07-1970  07/01/2019  Mr. [name] was observed post Covid-19 immunization for 15 minutes without incident. He was provided with Vaccine Information Sheet and instruction to access the V-Safe system.   Mr. [name] was instructed to call 911 with any severe reactions post vaccine: Marland Kitchen Difficulty breathing  . Swelling of face and throat  . A fast heartbeat  . A bad rash all over body  . Dizziness and weakness   Immunizations Administered    Name Date Dose VIS Date Route   Pfizer COVID-19 Vaccine 07/01/2019 11:56 AM 0.3 mL 02/28/2019 Intramuscular   Manufacturer: [place]   Lot: [id number]   [place]: [id number]

## 2020-01-08 ENCOUNTER — Other Ambulatory Visit: Payer: No Typology Code available for payment source

## 2020-01-08 DIAGNOSIS — Z20822 Contact with and (suspected) exposure to covid-19: Secondary | ICD-10-CM

## 2020-01-09 LAB — NOVEL CORONAVIRUS, NAA: SARS-CoV-2, NAA: DETECTED — AB

## 2020-01-09 LAB — SARS-COV-2, NAA 2 DAY TAT

## 2020-01-10 ENCOUNTER — Telehealth (HOSPITAL_COMMUNITY): Payer: Self-pay

## 2020-01-10 NOTE — Telephone Encounter (Signed)
Called to Discuss with patient about Covid symptoms and the use of the monoclonal antibody infusion for those with mild to moderate Covid symptoms and at a high risk of hospitalization.     Pt appears to qualify for this infusion due to co-morbid conditions and/or a member of an at-risk group in accordance with the FDA Emergency Use Authorization.    Pt stated his symptoms started on Monday night, 01/05/20 and tested positive on Thursday, 01/08/20. Pt states symptoms include nasal congestion, cough, muscle pain, and chills have subsided. Pt's qualifying risk factors include migraines, asthma, and BMI>25. Pt stated he is feeling better and will call back if he's interested in receiving an infusion.

## 2020-04-16 ENCOUNTER — Other Ambulatory Visit: Payer: Self-pay

## 2020-04-16 ENCOUNTER — Encounter: Payer: Self-pay | Admitting: Family Medicine

## 2020-04-16 ENCOUNTER — Ambulatory Visit (INDEPENDENT_AMBULATORY_CARE_PROVIDER_SITE_OTHER): Payer: No Typology Code available for payment source | Admitting: Family Medicine

## 2020-04-16 VITALS — BP 132/76 | HR 77 | Temp 98.1°F | Ht 71.0 in | Wt 189.0 lb

## 2020-04-16 DIAGNOSIS — E785 Hyperlipidemia, unspecified: Secondary | ICD-10-CM

## 2020-04-16 DIAGNOSIS — G43809 Other migraine, not intractable, without status migrainosus: Secondary | ICD-10-CM

## 2020-04-16 DIAGNOSIS — J309 Allergic rhinitis, unspecified: Secondary | ICD-10-CM

## 2020-04-16 DIAGNOSIS — Z85828 Personal history of other malignant neoplasm of skin: Secondary | ICD-10-CM | POA: Insufficient documentation

## 2020-04-16 DIAGNOSIS — Z0001 Encounter for general adult medical examination with abnormal findings: Secondary | ICD-10-CM

## 2020-04-16 DIAGNOSIS — R739 Hyperglycemia, unspecified: Secondary | ICD-10-CM

## 2020-04-16 DIAGNOSIS — Z1211 Encounter for screening for malignant neoplasm of colon: Secondary | ICD-10-CM

## 2020-04-16 LAB — CBC
MCH: 31.1 pg (ref 27.0–33.0)
MCHC: 35 g/dL (ref 32.0–36.0)
Platelets: 149 10*3/uL (ref 140–400)
WBC: 5.6 10*3/uL (ref 3.8–10.8)

## 2020-04-16 NOTE — Patient Instructions (Signed)
It was very nice to see you today!  We will check blood work today.  I will also get you set up to have Cologuard testing.  I will see back in year for your next annual checkup.  Please come back to see me sooner if needed.  Take care, Dr Jerline Pain  Please try these tips to maintain a healthy lifestyle:   Eat at least 3 REAL meals and 1-2 snacks per day.  Aim for no more than 5 hours between eating.  If you eat breakfast, please do so within one hour of getting up.    Each meal should contain half fruits/vegetables, one quarter protein, and one quarter carbs (no bigger than a computer mouse)   Cut down on sweet beverages. This includes juice, soda, and sweet tea.     Drink at least 1 glass of water with each meal and aim for at least 8 glasses per day   Exercise at least 150 minutes every week.    Preventive Care 67-5 Years Old, Male Preventive care refers to lifestyle choices and visits with your health care provider that can promote health and wellness. This includes:  A yearly physical exam. This is also called an annual wellness visit.  Regular dental and eye exams.  Immunizations.  Screening for certain conditions.  Healthy lifestyle choices, such as: ? Eating a healthy diet. ? Getting regular exercise. ? Not using drugs or products that contain nicotine and tobacco. ? Limiting alcohol use. What can I expect for my preventive care visit? Physical exam Your health care provider will check your:  Height and weight. These may be used to calculate your BMI (body mass index). BMI is a measurement that tells if you are at a healthy weight.  Heart rate and blood pressure.  Body temperature.  Skin for abnormal spots. Counseling Your health care provider may ask you questions about your:  Past medical problems.  Family's medical history.  Alcohol, tobacco, and drug use.  Emotional well-being.  Home life and relationship well-being.  Sexual  activity.  Diet, exercise, and sleep habits.  Work and work Statistician.  Access to firearms. What immunizations do I need? Vaccines are usually given at various ages, according to a schedule. Your health care provider will recommend vaccines for you based on your age, medical history, and lifestyle or other factors, such as travel or where you work.   What tests do I need? Blood tests  Lipid and cholesterol levels. These may be checked every 5 years, or more often if you are over 49 years old.  Hepatitis C test.  Hepatitis B test. Screening  Lung cancer screening. You may have this screening every year starting at age 47 if you have a 30-pack-year history of smoking and currently smoke or have quit within the past 15 years.  Prostate cancer screening. Recommendations will vary depending on your family history and other risks.  Genital exam to check for testicular cancer or hernias.  Colorectal cancer screening. ? All adults should have this screening starting at age 51 and continuing until age 44. ? Your health care provider may recommend screening at age 29 if you are at increased risk. ? You will have tests every 1-10 years, depending on your results and the type of screening test.  Diabetes screening. ? This is done by checking your blood sugar (glucose) after you have not eaten for a while (fasting). ? You may have this done every 1-3 years.  STD (sexually transmitted  disease) testing, if you are at risk. Follow these instructions at home: Eating and drinking  Eat a diet that includes fresh fruits and vegetables, whole grains, lean protein, and low-fat dairy products.  Take vitamin and mineral supplements as recommended by your health care provider.  Do not drink alcohol if your health care provider tells you not to drink.  If you drink alcohol: ? Limit how much you have to 0-2 drinks a day. ? Be aware of how much alcohol is in your drink. In the [place] one drink  equals one 12 oz bottle of beer (355 mL), one 5 oz glass of wine (148 mL), or one 1 oz glass of hard liquor (44 mL).   Lifestyle  Take daily care of your teeth and gums. Brush your teeth every morning and night with fluoride toothpaste. Floss one time each day.  Stay active. Exercise for at least 30 minutes 5 or more days each week.  Do not use any products that contain nicotine or tobacco, such as cigarettes, e-cigarettes, and chewing tobacco. If you need help quitting, ask your health care provider.  Do not use drugs.  If you are sexually active, practice safe sex. Use a condom or other form of protection to prevent STIs (sexually transmitted infections).  If told by your health care provider, take low-dose aspirin daily starting at age 14.  Find healthy ways to cope with stress, such as: ? Meditation, yoga, or listening to music. ? Journaling. ? Talking to a trusted person. ? Spending time with friends and family. Safety  Always wear your seat belt while driving or riding in a vehicle.  Do not drive: ? If you have been drinking alcohol. Do not ride with someone who has been drinking. ? When you are tired or distracted. ? While texting.  Wear a helmet and other protective equipment during sports activities.  If you have firearms in your house, make sure you follow all gun safety procedures. What's next?  Go to your health care provider once a year for an annual wellness visit.  Ask your health care provider how often you should have your eyes and teeth checked.  Stay up to date on all vaccines. This information is not intended to replace advice given to you by your health care provider. Make sure you discuss any questions you have with your health care provider. Document Revised: 12/03/2018 Document Reviewed: 02/28/2018 Elsevier Patient Education  2021 [employer].

## 2020-04-16 NOTE — Assessment & Plan Note (Signed)
Stable on zyrtec and flonase.

## 2020-04-16 NOTE — Progress Notes (Signed)
Chief Complaint:  Craig Conner is a 50 y.o. male who presents today for his annual comprehensive physical exam.    Assessment/Plan:  Chronic Problems Addressed Today: Allergic rhinitis Stable on zyrtec and flonase.   Migraine headache Follows with neurology / headache center. Gets botox injections and trigger point injections. On aimovig and pamelor 220m daily. Uses imitrex as needed.   Preventative Healthcare: Check labs.  Due for colon cancer screening-we will order Cologuard.  Patient Counseling(The following topics were reviewed and/or handout was given):  -Nutrition: Stressed importance of moderation in sodium/caffeine intake, saturated fat and cholesterol, caloric balance, sufficient intake of fresh fruits, vegetables, and fiber.  -Stressed the importance of regular exercise.   -Substance Abuse: Discussed cessation/primary prevention of tobacco, alcohol, or other drug use; driving or other dangerous activities under the influence; availability of treatment for abuse.   -Injury prevention: Discussed safety belts, safety helmets, smoke detector, smoking near bedding or upholstery.   -Sexuality: Discussed sexually transmitted diseases, partner selection, use of condoms, avoidance of unintended pregnancy and contraceptive alternatives.   -Dental health: Discussed importance of regular tooth brushing, flossing, and dental visits.  -Health maintenance and immunizations reviewed. Please refer to Health maintenance section.  Return to care in 1 year for next preventative visit.     Subjective:  HPI:  He has no acute complaints today.   Lifestyle Diet: Balanced. More fruits and vegetables. Cutting out meats.  Exercise: Doing more cardio 4-5 times per week.   Depression screen PHQ 2/9 04/16/2020  Decreased Interest 0  Down, Depressed, Hopeless 0  PHQ - 2 Score 0   Health Maintenance Due  Topic Date Due  . Hepatitis C Screening  Never done  . COLONOSCOPY (Pts 45-460yr Insurance coverage will need to be confirmed)  Never done     ROS: Per HPI, otherwise a complete review of systems was negative.   PMH:  The following were reviewed and entered/updated in epic: Past Medical History:  Diagnosis Date  . Anal fissure   . Arthritis   . Blood in stool   . [place]   skin  . Migraine headache   . Nasal congestion    Patient Active Problem List   Diagnosis Date Noted  . History of skin cancer 04/16/2020  . Hyperglycemia 04/16/2020  . Dyslipidemia 04/16/2020  . Allergic rhinitis 11/13/2008  . THROMBOCYTOPENIA 07/01/2007  . Migraine headache 10/12/2006   Past Surgical History:  Procedure Laterality Date  . ANAL FISSURE REPAIR  2002  . LIPOMA EXCISION  2002, 2003  . PILONIDAL CYST EXCISION  2002  . REPAIR HYPOSPADIAS W/ URETHROPLASTY    . SKIN CANCER EXCISION  01/2010    Family History  Problem Relation Age of Onset  . Migraines Other   . Heart disease Father     Medications- reviewed and updated Current Outpatient Medications  Medication Sig Dispense Refill  . cetirizine (ZYRTEC) 10 MG tablet Take 10 mg by mouth daily.    . fluticasone (FLONASE) 50 MCG/ACT nasal spray Place 2 sprays into the nose daily as needed. 16 g 11  . nortriptyline (PAMELOR) 50 MG capsule Takes 4 tablets daily.    . SUMAtriptan (IMITREX) 100 MG tablet   2  . AIMOVIG 140 MG/ML SOAJ Inject into the skin.     No current facility-administered medications for this visit.    Allergies-reviewed and updated No Known Allergies  Social History   Socioeconomic History  . Marital status: Married  Spouse name: Not on file  . Number of children: Not on file  . Years of education: Not on file  . Highest education level: Not on file  Occupational History  . Not on file  Tobacco Use  . Smoking status: Never Smoker  . Smokeless tobacco: Never Used  Substance and Sexual Activity  . Alcohol use: Yes    Comment: rare   . Drug use: No  . Sexual activity: Not on  file  Other Topics Concern  . Not on file  Social History Narrative  . Not on file   Social Determinants of Health   Financial Resource Strain: Not on file  Food Insecurity: Not on file  Transportation Needs: Not on file  Physical Activity: Not on file  Stress: Not on file  Social Connections: Not on file        Objective:  Physical Exam: BP 132/76   Pulse 77   Temp 98.1 F (36.7 C) (Temporal)   Ht 5' 11"  (1.803 m)   Wt 189 lb (85.7 kg)   SpO2 100%   BMI 26.36 kg/m   Body mass index is 26.36 kg/m. Wt Readings from Last 3 Encounters:  04/16/20 189 lb (85.7 kg)  01/10/20 180 lb (81.6 kg)  05/17/16 176 lb (79.8 kg)   Gen: NAD, resting comfortably HEENT: TMs normal bilaterally. OP clear. No thyromegaly noted.  CV: RRR with no murmurs appreciated Pulm: NWOB, CTAB with no crackles, wheezes, or rhonchi GI: Normal bowel sounds present. Soft, Nontender, Nondistended. MSK: no edema, cyanosis, or clubbing noted Skin: warm, dry Neuro: CN2-12 grossly intact. Strength 5/5 in upper and lower extremities. Reflexes symmetric and intact bilaterally.  Psych: Normal affect and thought content     Craig M. Jerline Pain, MD 04/16/2020 3:44 PM

## 2020-04-16 NOTE — Assessment & Plan Note (Signed)
Follows with neurology / headache center. Gets botox injections and trigger point injections. On aimovig and pamelor 249m daily. Uses imitrex as needed.

## 2020-04-17 LAB — LIPID PANEL
Cholesterol: 182 mg/dL (ref <200)
HDL: 35 mg/dL — ABNORMAL LOW (ref >=40)
LDL Cholesterol (Calc): 100 mg/dL (calc) — ABNORMAL HIGH
Non-HDL Cholesterol (Calc): 147 mg/dL (calc) — ABNORMAL HIGH (ref <130)
Total CHOL/HDL Ratio: 5.2 (calc) — ABNORMAL HIGH (ref <5.0)
Triglycerides: 353 mg/dL — ABNORMAL HIGH (ref <150)

## 2020-04-17 LAB — COMPREHENSIVE METABOLIC PANEL
AG Ratio: 1.9 (calc) (ref 1.0–2.5)
ALT: 17 U/L (ref 9–46)
AST: 18 U/L (ref 10–40)
Albumin: 4.3 g/dL (ref 3.6–5.1)
Alkaline phosphatase (APISO): 65 U/L (ref 36–130)
BUN: 17 mg/dL (ref 7–25)
CO2: 27 mmol/L (ref 20–32)
Calcium: 9.5 mg/dL (ref 8.6–10.3)
Chloride: 101 mmol/L (ref 98–110)
Creat: 1.06 mg/dL (ref 0.60–1.35)
Globulin: 2.3 g/dL (calc) (ref 1.9–3.7)
Glucose, Bld: 86 mg/dL (ref 65–99)
Potassium: 4.4 mmol/L (ref 3.5–5.3)
Sodium: 138 mmol/L (ref 135–146)
Total Bilirubin: 0.4 mg/dL (ref 0.2–1.2)
Total Protein: 6.6 g/dL (ref 6.1–8.1)

## 2020-04-17 LAB — CBC
HCT: 42 % (ref 38.5–50.0)
Hemoglobin: 14.7 g/dL (ref 13.2–17.1)
MCV: 89 fL (ref 80.0–100.0)
MPV: 10.9 fL (ref 7.5–12.5)
RBC: 4.72 10*6/uL (ref 4.20–5.80)
RDW: 11.9 % (ref 11.0–15.0)

## 2020-04-17 LAB — HEMOGLOBIN A1C
Hgb A1c MFr Bld: 4.9 % of total Hgb (ref <5.7)
Mean Plasma Glucose: 94 mg/dL
eAG (mmol/L): 5.2 mmol/L

## 2020-04-17 LAB — TSH: TSH: 1.27 mIU/L (ref 0.40–4.50)

## 2020-04-19 NOTE — Progress Notes (Signed)
Please inform patient of the following:  Cholesterol levels are borderline elevated. Everything else is NORMAL. Do not need to make any changes to his treatment plan at this time. Would like for him to keep working on diet and exercise and we can recheck in a year or so.  Craig Conner. Jerline Pain, MD 04/19/2020 8:29 AM

## 2020-05-06 LAB — COLOGUARD: Cologuard: NEGATIVE

## 2020-05-10 ENCOUNTER — Encounter: Payer: Self-pay | Admitting: Family Medicine

## 2020-05-11 NOTE — Telephone Encounter (Signed)
Ok to send referral  

## 2020-05-12 ENCOUNTER — Other Ambulatory Visit: Payer: Self-pay | Admitting: *Deleted

## 2020-05-12 DIAGNOSIS — C8590 Non-Hodgkin lymphoma, unspecified, unspecified site: Secondary | ICD-10-CM

## 2020-05-28 ENCOUNTER — Other Ambulatory Visit: Payer: Self-pay

## 2020-05-28 DIAGNOSIS — D179 Benign lipomatous neoplasm, unspecified: Secondary | ICD-10-CM

## 2020-06-25 ENCOUNTER — Ambulatory Visit: Payer: Self-pay | Admitting: General Surgery

## 2020-06-25 NOTE — H&P (Signed)
[place] Appointment: 06/25/2020 4:00 PM Location: [facility] Patient #: [id number] DOB: 1970-08-22 Married / Language: Cleophus Molt / Race: White Male   History of Present Illness Lavone Neri E. Grandville Silos MD; 06/25/2020 4:17 PM) The patient is a 50 year old male who presents with a complaint of Mass. Dimas Chyle asked me to see Beren Yniguez in consultation regarding multiple lipomas. He has a long history of these. Currently, he has one in his left bicep area, right forearm, right tricep area, and upper left buttock which are bothering him. They seemed to enlarge after he recovered from an episode of COVID. He has previously had other lipomas removed. They have never drained anything from the skin or had overlying skin changes although he has noticed a bit of discoloration on the ones of his left bicep and right forearm region.   Past Surgical History Jess Barters, [place]; 06/25/2020 3:46 PM) Anal Fissure Repair   Diagnostic Studies History Jess Barters, CMA; 06/25/2020 3:46 PM) Colonoscopy  never  Allergies Jess Barters, CMA; 06/25/2020 3:47 PM) No Known Drug Allergies  [06/25/2020]: Allergies Reconciled   Medication History Jess Barters, CMA; 06/25/2020 3:48 PM) Nortriptyline HCl (50MG Capsule, Oral) Active. Ciprofloxacin HCl (500MG Tablet, Oral) Active. Emgality (120MG/ML Soln Auto-inj, Subcutaneous) Active. Aimovig (140MG/ML Soln Auto-inj, Subcutaneous) Active. Medications Reconciled  Social History Jess Barters, CMA; 06/25/2020 3:46 PM) Caffeine use  Coffee. No alcohol use  No drug use  Tobacco use  Never smoker.  Family History Jess Barters, CMA; 06/25/2020 3:46 PM) Alcohol Abuse  Father. Arthritis  Mother. Heart Disease  Father. Heart disease in male family member before age 27  Migraine Headache  Brother, Father.  Other Problems Jess Barters, CMA; 06/25/2020 3:46 PM) Melanoma  Migraine Headache  Other disease, cancer, significant illness     Review of  Systems Jess Barters CMA; 06/25/2020 3:46 PM) General Not Present- Appetite Loss, Chills, Fatigue, Fever, Night Sweats, Weight Gain and Weight Loss. Skin Not Present- Change in Wart/Mole, Dryness, Hives, Jaundice, New Lesions, Non-Healing Wounds, Rash and Ulcer. HEENT Present- Ringing in the Ears, Seasonal Allergies and Wears glasses/contact lenses. Not Present- Earache, Hearing Loss, Hoarseness, Nose Bleed, Oral Ulcers, Sinus Pain, Sore Throat, Visual Disturbances and Yellow Eyes. Respiratory Not Present- Bloody sputum, Chronic Cough, Difficulty Breathing, Snoring and Wheezing. Breast Not Present- Breast Mass, Breast Pain, Nipple Discharge and Skin Changes. Cardiovascular Not Present- Chest Pain, Difficulty Breathing Lying Down, Leg Cramps, Palpitations, Rapid Heart Rate, Shortness of Breath and Swelling of Extremities. Gastrointestinal Not Present- Abdominal Pain, Bloating, Bloody Stool, Change in Bowel Habits, Chronic diarrhea, Constipation, Difficulty Swallowing, Excessive gas, Gets full quickly at meals, Hemorrhoids, Indigestion, Nausea, Rectal Pain and Vomiting. Male Genitourinary Present- Frequency and Urgency. Not Present- Blood in Urine, Change in Urinary Stream, Impotence, Nocturia, Painful Urination and Urine Leakage. Musculoskeletal Not Present- Back Pain, Joint Pain, Joint Stiffness, Muscle Pain, Muscle Weakness and Swelling of Extremities. Neurological Present- Headaches. Not Present- Decreased Memory, Fainting, Numbness, Seizures, Tingling, Tremor, Trouble walking and Weakness. Psychiatric Not Present- Anxiety, Bipolar, Change in Sleep Pattern, Depression, Fearful and Frequent crying. Endocrine Not Present- Cold Intolerance, Excessive Hunger, Hair Changes, Heat Intolerance, Hot flashes and New Diabetes. Hematology Not Present- Blood Thinners, Easy Bruising, Excessive bleeding, Gland problems, HIV and Persistent Infections.  Vitals Jess Barters CMA; 06/25/2020 3:47 PM) 06/25/2020 3:46  PM Weight: 191 lb Height: 80in Body Surface Area: 2.25 m Body Mass Index: 20.98 kg/m  Temp.: 28F  Pulse: 92 (Regular)  P.OX: 97% (Room air) BP: 140/100(Sitting, Left  Arm, Standard)       Physical Exam Lavone Neri E. Grandville Silos MD; 06/25/2020 4:19 PM) General Mental Status-Alert. General Appearance-Consistent with stated age. Hydration-Well hydrated. Voice-Normal.  Head and Neck Head-normocephalic, atraumatic with no lesions or palpable masses. Trachea-midline. Thyroid Gland Characteristics - normal size and consistency.  Eye Eyeball - Bilateral-Extraocular movements intact. Sclera/Conjunctiva - Bilateral-No scleral icterus.  Chest and Lung Exam Chest and lung exam reveals -quiet, even and easy respiratory effort with no use of accessory muscles and on auscultation, normal breath sounds, no adventitious sounds and normal vocal resonance. Inspection Chest Wall - Normal. Back - normal.  Cardiovascular Cardiovascular examination reveals -normal heart sounds, regular rate and rhythm with no murmurs and normal pedal pulses bilaterally.  Abdomen Inspection Inspection of the abdomen reveals - No Hernias. Palpation/Percussion Palpation and Percussion of the abdomen reveal - Soft, Non Tender, No Rebound tenderness, No Rigidity (guarding) and No hepatosplenomegaly. Auscultation Auscultation of the abdomen reveals - Bowel sounds normal.  Neurologic Neurologic evaluation reveals -alert and oriented x 3 with no impairment of recent or remote memory. Mental Status-Normal.  Musculoskeletal Global Assessment -Note: no gross deformities.  Normal Exam - Left-Upper Extremity Strength Normal and Lower Extremity Strength Normal. Normal Exam - Right-Upper Extremity Strength Normal and Lower Extremity Strength Normal. Note: Left bicep 1 cm subcutaneous soft mass, right forearm 1 cm subcutaneous soft mass, right tricep has 3 subcutaneous masses in  close proximity one 1 cm, one 3 cm, and another one 1 cm, left upper buttock has a 3 cm soft subcutaneous mass. None of these have overlying skin changes. No evidence of infection. No drainage.   Lymphatic Head & Neck  General Head & Neck Lymphatics: Bilateral - Description - Normal. Axillary  General Axillary Region: Bilateral - Description - Normal. Tenderness - Non Tender. Femoral & Inguinal  Generalized Femoral & Inguinal Lymphatics: Bilateral - Description - No Generalized lymphadenopathy.    Assessment & Plan Lavone Neri E. 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 I have offered removal as an outpatient surgical procedure. I discussed the procedure, risks, and benefits with him. I also discussed the expected postoperative course. 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 He would like to schedule around June.  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 We will remove at the same time as his arm lipomas as described above.

## 2020-09-21 ENCOUNTER — Telehealth (INDEPENDENT_AMBULATORY_CARE_PROVIDER_SITE_OTHER): Payer: No Typology Code available for payment source | Admitting: Physician Assistant

## 2020-09-21 DIAGNOSIS — B9689 Other specified bacterial agents as the cause of diseases classified elsewhere: Secondary | ICD-10-CM

## 2020-09-21 DIAGNOSIS — J019 Acute sinusitis, unspecified: Secondary | ICD-10-CM | POA: Diagnosis not present

## 2020-09-21 MED ORDER — AMOXICILLIN-POT CLAVULANATE 875-125 MG PO TABS: 1.0000 | ORAL_TABLET | Freq: Two times a day (BID) | ORAL | 0 refills | Status: AC

## 2020-09-21 NOTE — Patient Instructions (Signed)
Take augmentin as directed Recheck if worse or no better

## 2020-09-21 NOTE — Progress Notes (Signed)
Virtual Visit via Video Note  I connected with [place] on 09/21/20 at 11:30 AM EDT by a video enabled telemedicine application and verified that I am speaking with the correct person using two identifiers.  Location: Patient: home Provider: Therapist, music at [place] present: Myself and patient   I discussed the limitations of evaluation and management by telemedicine and the availability of in person appointments. The patient expressed understanding and agreed to proceed.   History of Present Illness: Chief complaint: Sore throat, sinus pressure Symptom onset: 3 weeks ago Pertinent positives: Ear fullness, ST, cough Pertinent negatives: Loss of taste/smell, chest pain, SOB Treatments tried: Decongestant and also antibiotic eye drops (pink eye with this illness, has cleared).  Vaccine status: UTD COVID vax + booster Sick exposure: Family has had similar symptoms   Home COVID tests have been negative    Observations/Objective:   Gen: Awake, alert, no acute distress Resp: Breathing is even and non-labored Psych: calm/pleasant demeanor Neuro: Alert and Oriented x 3, + facial symmetry, speech is clear.   Assessment and Plan: 1. Acute bacterial sinusitis Persistent symptoms >10 days despite conservative efforts at home. Will Rx Augmentin at this time, take with food. Cautioned on antibiotic use and possible side effects. Advised nasal saline, humidifier, and pushing fluids. Call if worse or no improvement.    Follow Up Instructions:    I discussed the assessment and treatment plan with the patient. The patient was provided an opportunity to ask questions and all were answered. The patient agreed with the plan and demonstrated an understanding of the instructions.   The patient was advised to call back or seek an in-person evaluation if the symptoms worsen or if the condition fails to improve as anticipated.  Donte Lenzo M Charleton Deyoung, PA-C

## 2020-10-12 ENCOUNTER — Telehealth (INDEPENDENT_AMBULATORY_CARE_PROVIDER_SITE_OTHER): Payer: No Typology Code available for payment source | Admitting: Family Medicine

## 2020-10-12 ENCOUNTER — Encounter: Payer: Self-pay | Admitting: Family Medicine

## 2020-10-12 VITALS — Ht 71.0 in | Wt 180.0 lb

## 2020-10-12 DIAGNOSIS — J329 Chronic sinusitis, unspecified: Secondary | ICD-10-CM | POA: Diagnosis not present

## 2020-10-12 DIAGNOSIS — G43809 Other migraine, not intractable, without status migrainosus: Secondary | ICD-10-CM | POA: Diagnosis not present

## 2020-10-12 DIAGNOSIS — J309 Allergic rhinitis, unspecified: Secondary | ICD-10-CM | POA: Diagnosis not present

## 2020-10-12 MED ORDER — AZELASTINE HCL 0.1 % NA SOLN: 2.0000 | Freq: Two times a day (BID) | NASAL | 0 refills | Status: DC

## 2020-10-12 MED ORDER — LEVOFLOXACIN 500 MG PO TABS: 500.0000 mg | ORAL_TABLET | Freq: Every day | ORAL | 0 refills | Status: AC

## 2020-10-12 NOTE — Progress Notes (Signed)
   Craig Conner is a 50 y.o. male who presents today for a virtual office visit.  Assessment/Plan:  New/Acute Problems: Sinusitis No red flags.  Did not have complete resolution with Augmentin.  Will start course of Levaquin.  Also start Astelin nasal spray.  Encourage good oral hydration.  Discussed reasons to return to care.  Chronic Problems Addressed Today: Allergic rhinitis Occasionally takes Zyrtec and Flonase.  We will add on Astelin for recurrent sinus infection as well.  Migraine headache Worsened due to sinus infection.  Follows with neurology.  We will continue current medications per neurology and hopefully symptoms will improve with treatment of his sinus infection.     Subjective:  HPI:  Patient here with concerns for sinus infection.  Symptoms started several weeks ago.  Got a course of Augmentin about 3 weeks ago.  He finished it about a week ago.  Symptoms had resolved but have started coming back over the last several days.  Has had some productive cough.  Headache is worsened.  He has been trying to take over-the-counter Mucinex without significant improvement.  Wife has been sick with similar symptoms.       Objective/Observations  Physical Exam: Gen: NAD, resting comfortably Pulm: Normal work of breathing Neuro: Grossly normal, moves all extremities Psych: Normal affect and thought content  Virtual Visit via Video   I connected with Craig Conner on 10/12/20 at  4:00 PM EDT by a video enabled telemedicine application and verified that I am speaking with the correct person using two identifiers. The limitations of evaluation and management by telemedicine and the availability of in person appointments were discussed. The patient expressed understanding and agreed to proceed.   Patient location: Home Provider location: [place] participating in the virtual visit: Myself and Patient     Algis Greenhouse. Jerline Pain, MD 10/12/2020 12:32  PM

## 2020-10-12 NOTE — Assessment & Plan Note (Signed)
Worsened due to sinus infection.  Follows with neurology.  We will continue current medications per neurology and hopefully symptoms will improve with treatment of his sinus infection.

## 2020-10-12 NOTE — Assessment & Plan Note (Signed)
Occasionally takes Zyrtec and Flonase.  We will add on Astelin for recurrent sinus infection as well.

## 2020-11-03 ENCOUNTER — Other Ambulatory Visit: Payer: Self-pay | Admitting: Family Medicine

## 2021-02-08 ENCOUNTER — Other Ambulatory Visit: Payer: Self-pay

## 2021-02-08 ENCOUNTER — Ambulatory Visit: Payer: No Typology Code available for payment source | Admitting: Family

## 2021-02-08 ENCOUNTER — Encounter: Payer: Self-pay | Admitting: Family

## 2021-02-08 ENCOUNTER — Ambulatory Visit (INDEPENDENT_AMBULATORY_CARE_PROVIDER_SITE_OTHER)
Admission: RE | Admit: 2021-02-08 | Discharge: 2021-02-08 | Disposition: A | Discharge status: Home / Self Care | Payer: No Typology Code available for payment source | Source: Ambulatory Visit | Attending: Family | Admitting: Family

## 2021-02-08 VITALS — BP 117/79 | HR 79 | Temp 98.1°F | Ht 71.0 in | Wt 194.8 lb

## 2021-02-08 DIAGNOSIS — M79674 Pain in right toe(s): Secondary | ICD-10-CM

## 2021-02-08 DIAGNOSIS — H6122 Impacted cerumen, left ear: Secondary | ICD-10-CM | POA: Insufficient documentation

## 2021-02-08 NOTE — Assessment & Plan Note (Signed)
Removed through office ear lavage, mild erythema in canal, pt reports less congestion. Advised ok to use generic OTC debrox drops monthly to keep clear prn.

## 2021-02-08 NOTE — Assessment & Plan Note (Signed)
Reports bumping against the ground while mountain biking. States he has had toe fractures in the past. Xray ordered. Advised to continue icing tid.

## 2021-02-08 NOTE — Progress Notes (Signed)
Subjective:     Patient ID: Craig Conner, male    DOB: 08-14-1970, 50 y.o.   MRN: [id number]  Chief Complaint  Patient presents with   Ear Fullness    Pt says that he recently had sinus infection, he has had some left ear soreness since then. Tender when touching.     HPI Otitis Externa: Patient presents with pressure, congestion, & decreased hearing.  Symptoms have been present for 2 weeks.  During that time there has not been pus draining out of the. Previous treatment: Augmentin for sinus infection 2 weeks ago. There has been some improvement with current treatment.  There has not been a recent history of swimming.  Pain He reports new onset right middle toe pain. was an injury that may have caused the pain. The pain started about a week ago and is staying constant. The pain does not radiate. The pain is described as soreness, is mild in intensity, occurring intermittently. Symptoms are worse in the: evening  Aggravating factors: walking and walking uphill He has tried application of ice with mild relief.      Health Maintenance Due  Topic Date Due   Pneumococcal Vaccine 73-43 Years old (1 - PCV) Never done   Hepatitis C Screening  Never done   COLONOSCOPY (Pts 45-39yr Insurance coverage will need to be confirmed)  Never done    Past Medical History:  Diagnosis Date   Anal fissure    Arthritis    Blood in stool    Cancer ([place]    skin   Migraine headache    Nasal congestion     Past Surgical History:  Procedure Laterality Date   ANAL FISSURE REPAIR  2002   LIPOMA EXCISION  2002, 2003   PILONIDAL CYST EXCISION  2002   REPAIR HYPOSPADIAS W/ URETHROPLASTY     SKIN CANCER EXCISION  01/2010    Outpatient Medications Prior to Visit  Medication Sig Dispense Refill   Azelastine HCl 137 MCG/SPRAY SOLN PLACE 2 SPRAYS INTO BOTH NOSTRILS 2 (TWO) TIMES DAILY 30 mL 1   cetirizine (ZYRTEC) 10 MG tablet Take 10 mg by mouth daily.     Galcanezumab-gnlm (EMGALITY) 120 MG/ML  SOAJ DISPENSE ONE- 120 MG AUTOINJECTORS ONCE PER MONTH FOR MIGRAINE PREVENTION     nortriptyline (PAMELOR) 50 MG capsule Takes 4 tablets daily.     SUMAtriptan (IMITREX) 100 MG tablet   2   No facility-administered medications prior to visit.    No Known Allergies      Objective:    Physical Exam Vitals and nursing note reviewed.  Constitutional:      General: He is not in acute distress.    Appearance: Normal appearance.  HENT:     Head: Normocephalic.     Right Ear: Ear canal normal. Tympanic membrane is scarred.     Left Ear: Tenderness present. No drainage or swelling. Tympanic membrane is scarred. Tympanic membrane is not erythematous.     Ears:     Comments: Mild erythema in left ear canal after cerumen removal. Cardiovascular:     Rate and Rhythm: Normal rate and regular rhythm.     Pulses:          Dorsalis pedis pulses are 2+ on the right side.  Pulmonary:     Effort: Pulmonary effort is normal.     Breath sounds: Normal breath sounds.  Musculoskeletal:     Cervical back: Normal range of motion.     Right  foot: Decreased range of motion.       Feet:  Feet:     Right foot:     Skin integrity: Skin integrity normal. No erythema or warmth.     Comments: Mild ecchymosis and swelling on right middle toe. Skin:    General: Skin is warm and dry.  Neurological:     Mental Status: He is alert and oriented to person, place, and time.  Psychiatric:        Mood and Affect: Mood normal.    BP 117/79   Pulse 79   Temp 98.1 F (36.7 C) (Temporal)   Ht 5' 11"  (1.803 m)   Wt 194 lb 12.8 oz (88.4 kg)   SpO2 100%   BMI 27.17 kg/m  Wt Readings from Last 3 Encounters:  02/08/21 194 lb 12.8 oz (88.4 kg)  10/12/20 180 lb (81.6 kg)  04/16/20 189 lb (85.7 kg)       Assessment & Plan:   Problem List Items Addressed This Visit       Nervous and Auditory   Impacted cerumen of left ear    Removed through office ear lavage, mild erythema in canal, pt reports less  congestion. Advised ok to use generic OTC debrox drops monthly to keep clear prn.        Other   Toe pain, right - Primary    Reports bumping against the ground while mountain biking. States he has had toe fractures in the past. Xray ordered. Advised to continue icing tid.      Relevant Orders   DG Foot 2 Views Right    No orders of the defined types were placed in this encounter.

## 2021-02-08 NOTE — Patient Instructions (Signed)
It was very nice to see you today!  You can pick up generic Debrox ear drops to use in the future to help remove the wax, ok to use monthly.  I have sent the order for your foot xray to our [place] office on [facility]. We will call you with the results within 24 hours after.    PLEASE NOTE:  If you had any lab tests please let us know if you have not heard back within a few days. You may see your results on MyChart before we have a chance to review them but we will give you a call once they are reviewed by [place]. If we ordered any referrals today, please let us know if you have not heard from their office within the next week.   Please try these tips to maintain a healthy lifestyle:  Eat most of your calories during the day when you are active. Eliminate processed foods including packaged sweets (pies, cakes, cookies), reduce intake of potatoes, white bread, white pasta, and white rice. Look for whole grain options, oat flour or almond flour.  Each meal should contain half fruits/vegetables, one quarter protein, and one quarter carbs (no bigger than a computer mouse).  Cut down on sweet beverages. This includes juice, soda, and sweet tea. Also watch fruit intake, though this is a healthier sweet option, it still contains natural sugar! Limit to 3 servings daily.  Drink at least 1 glass of water with each meal and aim for at least 8 glasses per day  Exercise at least 150 minutes every week.

## 2021-02-09 NOTE — Progress Notes (Signed)
Toe is not broken! Continue icing, ok to tape if helps with discomfort. If you would still like to see a podiatrist, I will be glad to refer. Thanks.

## 2021-02-18 ENCOUNTER — Ambulatory Visit: Payer: Self-pay | Admitting: General Surgery

## 2021-02-18 NOTE — H&P (View-Only) (Signed)
PROVIDER:  Leanora Ivanoff, MD   MRN: [id number] DOB: [birth year] DATE OF ENCOUNTER: 02/18/2021 Subjective    Chief Complaint: Long Term Follow Up  (Discuss possible surgery/Lipoma)       History of Present Illness: Craig Conner is a 50 y.o. male who is seen today for follow-up of multiple lipomas including left bicep area, left buttock, right tricep area, and right forearm.  They are continuing to give him pain and he would like to schedule surgery.  I initially saw him earlier this year.  There has been no drainage from the skin..       Review of Systems: A complete review of systems was obtained from the patient.  I have reviewed this information and discussed as appropriate with the patient.  See HPI as well for other ROS.   ROS      Medical History: Past Medical History      Past Medical History:  Diagnosis Date   Anxiety     Arthritis     History of cancer     Hyperlipidemia             Patient Active Problem List  Diagnosis   Allergic rhinitis   Dyslipidemia   History of skin cancer   Hyperglycemia   Hypospadias   Impacted cerumen of left ear   Migraine headache   Migraine headache   Thrombocytopenia (CMS-HCC)   Toe pain, right      Past Surgical History       Past Surgical History:  Procedure Laterality Date   ANAL FISSURECTOMY   2002   INCISION & DRAINAGE PILONIDAL CYST   2002   Lipoma   2006   REPAIR HYPOSPADIAS W/ URETHROPLASTY       Skin Cancer   01/2010        Allergies  No Known Allergies           Current Outpatient Medications on File Prior to Visit  Medication Sig Dispense Refill   azelastine (ASTELIN) 137 mcg nasal spray Place 2 sprays into both nostrils 2 (two) times daily       galcanezumab-gnlm (EMGALITY PEN) 120 mg/mL PnIj DISPENSE ONE- 120 MG AUTOINJECTORS ONCE PER MONTH FOR MIGRAINE PREVENTION       SUMAtriptan (IMITREX) 100 MG tablet 1 TAB AS NEEDED FOR MIGRAINE MAY REPEAT AFTER 2 HOURS. LIMIT TWO TABS/24 HOURS        cetirizine (ZYRTEC) 10 MG tablet Take 1 tablet (10 mg total) by mouth once daily       nortriptyline (PAMELOR) 50 MG capsule Take 4 capsules (200 mg total) by mouth at bedtime        No current facility-administered medications on file prior to visit.      Family History  History reviewed. No pertinent family history.      Social History       Tobacco Use  Smoking Status Never  Smokeless Tobacco Never      Social History  Social History        Socioeconomic History   Marital status: Married  Tobacco Use   Smoking status: Never   Smokeless tobacco: Never  Vaping Use   Vaping Use: Never used  Substance and Sexual Activity   Alcohol use: Not Currently   Drug use: Never        Objective:      There were no vitals filed for this visit.  There is no height or weight on file to calculate BMI.  Physical Exam    General appearance - alert, well appearing, and in no distress Mental status - alert, oriented to person, place, and time Chest - clear to auscultation, no wheezes, rales or rhonchi, symmetric air entry Heart - normal rate, regular rhythm, normal S1, S2, no murmurs, rubs, clicks or gallops Abdomen - soft, nontender, nondistended, no masses or organomegaly Musculoskeletal - Right triceps area soft subcutaneous mass 4 cm, right forearm soft subcutaneous mass 2 cm, left biceps area soft subcutaneous mass 2 cm Left upper buttock soft subcutaneous mass 3 cm     Labs, Imaging and Diagnostic Testing:   .     Assessment and Plan:  Diagnoses and all orders for this visit:   Lipoma of left upper extremity   Lipoma of right upper extremity   Lipoma of torso       Lipoma right triceps area 4 cm Lipoma right forearm 2 cm Lipoma left biceps area 2 cm Lipoma left upper buttock 3 cm   These are all very symptomatic.  I would recommend removal as an outpatient surgical procedure.  I discussed the procedure, risks, and benefits with him.  I would discussed  the need to avoid heavy lifting or activity for 3 weeks after surgery.  He looks forward to scheduling.   No follow-ups on file.   Leanora Ivanoff, MD

## 2021-02-18 NOTE — H&P (Signed)
PROVIDER:  Leanora Ivanoff, MD   MRN: [id number] DOB: 1970-06-07 DATE OF ENCOUNTER: 02/18/2021 Subjective    Chief Complaint: Long Term Follow Up  (Discuss possible surgery/Lipoma)       History of Present Illness: Craig Conner is a 50 y.o. male who is seen today for follow-up of multiple lipomas including left bicep area, left buttock, right tricep area, and right forearm.  They are continuing to give him pain and he would like to schedule surgery.  I initially saw him earlier this year.  There has been no drainage from the skin..       Review of Systems: A complete review of systems was obtained from the patient.  I have reviewed this information and discussed as appropriate with the patient.  See HPI as well for other ROS.   ROS      Medical History: Past Medical History      Past Medical History:  Diagnosis Date   Anxiety     Arthritis     History of cancer     Hyperlipidemia             Patient Active Problem List  Diagnosis   Allergic rhinitis   Dyslipidemia   History of skin cancer   Hyperglycemia   Hypospadias   Impacted cerumen of left ear   Migraine headache   Migraine headache   Thrombocytopenia (CMS-HCC)   Toe pain, right      Past Surgical History       Past Surgical History:  Procedure Laterality Date   ANAL FISSURECTOMY   2002   INCISION & DRAINAGE PILONIDAL CYST   2002   Lipoma   2006   REPAIR HYPOSPADIAS W/ URETHROPLASTY       Skin Cancer   01/2010        Allergies  No Known Allergies           Current Outpatient Medications on File Prior to Visit  Medication Sig Dispense Refill   azelastine (ASTELIN) 137 mcg nasal spray Place 2 sprays into both nostrils 2 (two) times daily       galcanezumab-gnlm (EMGALITY PEN) 120 mg/mL PnIj DISPENSE ONE- 120 MG AUTOINJECTORS ONCE PER MONTH FOR MIGRAINE PREVENTION       SUMAtriptan (IMITREX) 100 MG tablet 1 TAB AS NEEDED FOR MIGRAINE MAY REPEAT AFTER 2 HOURS. LIMIT TWO TABS/24 HOURS        cetirizine (ZYRTEC) 10 MG tablet Take 1 tablet (10 mg total) by mouth once daily       nortriptyline (PAMELOR) 50 MG capsule Take 4 capsules (200 mg total) by mouth at bedtime        No current facility-administered medications on file prior to visit.      Family History  History reviewed. No pertinent family history.      Social History       Tobacco Use  Smoking Status Never  Smokeless Tobacco Never      Social History  Social History        Socioeconomic History   Marital status: Married  Tobacco Use   Smoking status: Never   Smokeless tobacco: Never  Vaping Use   Vaping Use: Never used  Substance and Sexual Activity   Alcohol use: Not Currently   Drug use: Never        Objective:      There were no vitals filed for this visit.  There is no height or weight on file to calculate BMI.  Physical Exam    General appearance - alert, well appearing, and in no distress Mental status - alert, oriented to person, place, and time Chest - clear to auscultation, no wheezes, rales or rhonchi, symmetric air entry Heart - normal rate, regular rhythm, normal S1, S2, no murmurs, rubs, clicks or gallops Abdomen - soft, nontender, nondistended, no masses or organomegaly Musculoskeletal - Right triceps area soft subcutaneous mass 4 cm, right forearm soft subcutaneous mass 2 cm, left biceps area soft subcutaneous mass 2 cm Left upper buttock soft subcutaneous mass 3 cm     Labs, Imaging and Diagnostic Testing:   .     Assessment and Plan:  Diagnoses and all orders for this visit:   Lipoma of left upper extremity   Lipoma of right upper extremity   Lipoma of torso       Lipoma right triceps area 4 cm Lipoma right forearm 2 cm Lipoma left biceps area 2 cm Lipoma left upper buttock 3 cm   These are all very symptomatic.  I would recommend removal as an outpatient surgical procedure.  I discussed the procedure, risks, and benefits with him.  I would discussed  the need to avoid heavy lifting or activity for 3 weeks after surgery.  He looks forward to scheduling.   No follow-ups on file.   Leanora Ivanoff, MD

## 2021-03-01 NOTE — Pre-Procedure Instructions (Signed)
Surgical Instructions    Your procedure is scheduled on Thursday 03/10/21.   Report to [facility] Main Entrance "A" at 08:30 A.M., then check in with the Admitting office.  Call this number if you have problems the morning of surgery:  [phone]   If you have any questions prior to your surgery date call [phone]: Open Monday-Friday 8am-4pm    Remember:  Do not eat after midnight the night before your surgery  You may drink clear liquids until 07:30 A.M. the morning of your surgery.   Clear liquids allowed are: Water, Non-Citrus Juices (without pulp), Carbonated Beverages, Clear Tea, Black Coffee ONLY (NO MILK, CREAM OR POWDERED CREAMER of any kind), and Gatorade    Take these medicines the morning of surgery with A SIP OF WATER   SUMAtriptan (IMITREX)- If needed  As of today, STOP taking any Aspirin (unless otherwise instructed by your surgeon) Aleve, Naproxen, Ibuprofen, Motrin, Advil, Goody's, BC's, all herbal medications, fish oil, and all vitamins.     After your COVID test   You are not required to quarantine however you are required to wear a well-fitting mask when you are out and around people not in your household.  If your mask becomes wet or soiled, replace with a new one.  Wash your hands often with soap and water for 20 seconds or clean your hands with an alcohol-based hand sanitizer that contains at least 60% alcohol.  Do not share personal items.  Notify your provider: if you are in close contact with someone who has COVID  or if you develop a fever of 100.4 or greater, sneezing, cough, sore throat, shortness of breath or body aches.             Do not wear jewelry or makeup Do not wear lotions, powders, perfumes/colognes, or deodorant. Do not shave 48 hours prior to surgery.  Men may shave face and neck. Do not bring valuables to the hospital. DO Not wear nail polish, gel polish, artificial nails, or any other type of covering on natural nails  including finger and toenails. If patients have artificial nails, gel coating, etc. that need to be removed by a nail salon, please have this removed prior to surgery or surgery may need to be canceled/delayed if the surgeon/ anesthesia feels like the patient is unable to be adequately monitored.             [facility] is not responsible for any belongings or valuables.  Do NOT Smoke (Tobacco/Vaping)  24 hours prior to your procedure  If you use a CPAP at night, you may bring your mask for your overnight stay.   Contacts, glasses, hearing aids, dentures or partials may not be worn into surgery, please bring cases for these belongings   For patients admitted to the hospital, discharge time will be determined by your treatment team.   Patients discharged the day of surgery will not be allowed to drive home, and someone needs to stay with them for 24 hours.  NO VISITORS WILL BE ALLOWED IN PRE-OP WHERE PATIENTS ARE PREPPED FOR SURGERY.  ONLY 1 SUPPORT PERSON MAY BE PRESENT IN THE WAITING ROOM WHILE YOU ARE IN SURGERY.  IF YOU ARE TO BE ADMITTED, ONCE YOU ARE IN YOUR ROOM YOU WILL BE ALLOWED TWO (2) VISITORS. 1 (ONE) VISITOR MAY STAY OVERNIGHT BUT MUST ARRIVE TO THE ROOM BY 8pm.  Minor children may have two parents present. Special consideration for safety and communication needs will be  reviewed on a case by case basis.  Special instructions:    Oral Hygiene is also important to reduce your risk of infection.  Remember - BRUSH YOUR TEETH THE MORNING OF SURGERY WITH YOUR REGULAR TOOTHPASTE   [facility]- Preparing For Surgery  Before surgery, you can play an important role. Because skin is not sterile, your skin needs to be as free of germs as possible. You can reduce the number of germs on your skin by washing with CHG (chlorahexidine gluconate) Soap before surgery.  CHG is an antiseptic cleaner which kills germs and bonds with the skin to continue killing germs even after washing.     Please  do not use if you have an allergy to CHG or antibacterial soaps. If your skin becomes reddened/irritated stop using the CHG.  Do not shave (including legs and underarms) for at least 48 hours prior to first CHG shower. It is OK to shave your face.  Please follow these instructions carefully.     Shower the NIGHT BEFORE SURGERY and the MORNING OF SURGERY with CHG Soap.   If you chose to wash your hair, wash your hair first as usual with your normal shampoo. After you shampoo, rinse your hair and body thoroughly to remove the shampoo.  Then [employer] and genitals (private parts) with your normal soap and rinse thoroughly to remove soap.  After that Use CHG Soap as you would any other liquid soap. You can apply CHG directly to the skin and wash gently with a scrungie or a clean washcloth.   Apply the CHG Soap to your body ONLY FROM THE NECK DOWN.  Do not use on open wounds or open sores. Avoid contact with your eyes, ears, mouth and genitals (private parts). Wash Face and genitals (private parts)  with your normal soap.   Wash thoroughly, paying special attention to the area where your surgery will be performed.  Thoroughly rinse your body with warm water from the neck down.  DO NOT shower/wash with your normal soap after using and rinsing off the CHG Soap.  Pat yourself dry with a CLEAN TOWEL.  Wear CLEAN PAJAMAS to bed the night before surgery  Place CLEAN SHEETS on your bed the night before your surgery  DO NOT SLEEP WITH PETS.   Day of Surgery:  Take a shower with CHG soap. Wear Clean/Comfortable clothing the morning of surgery Do not apply any deodorants/lotions.   Remember to brush your teeth WITH YOUR REGULAR TOOTHPASTE.   Please read over the following fact sheets that you were given.

## 2021-03-02 ENCOUNTER — Encounter (HOSPITAL_COMMUNITY)
Admission: RE | Admit: 2021-03-02 | Discharge: 2021-03-02 | Disposition: A | Discharge status: Home / Self Care | Payer: No Typology Code available for payment source | Source: Ambulatory Visit | Attending: General Surgery | Admitting: General Surgery

## 2021-03-02 ENCOUNTER — Other Ambulatory Visit: Payer: Self-pay

## 2021-03-02 ENCOUNTER — Encounter (HOSPITAL_COMMUNITY): Payer: Self-pay

## 2021-03-02 VITALS — BP 133/91 | HR 72 | Temp 97.6°F | Resp 18 | Ht 71.0 in | Wt 192.0 lb

## 2021-03-02 DIAGNOSIS — Z01818 Encounter for other preprocedural examination: Secondary | ICD-10-CM

## 2021-03-02 DIAGNOSIS — Z01812 Encounter for preprocedural laboratory examination: Secondary | ICD-10-CM | POA: Insufficient documentation

## 2021-03-02 LAB — CBC
HCT: 42.6 % (ref 39.0–52.0)
Hemoglobin: 14.5 g/dL (ref 13.0–17.0)
MCH: 30.7 pg (ref 26.0–34.0)
MCHC: 34 g/dL (ref 30.0–36.0)
MCV: 90.1 fL (ref 80.0–100.0)
Platelets: 127 10*3/uL — ABNORMAL LOW (ref 150–400)
RBC: 4.73 MIL/uL (ref 4.22–5.81)
RDW: 11.4 % — ABNORMAL LOW (ref 11.5–15.5)
WBC: 5.6 10*3/uL (ref 4.0–10.5)
nRBC: 0 % (ref 0.0–0.2)

## 2021-03-02 NOTE — Progress Notes (Signed)
PCP - Dr. Dimas Chyle Cardiologist - denies  PPM/ICD - n/a Device Orders - n/a Rep Notified - n/a  Chest x-ray - n/a EKG - n/a Stress Test - denies ECHO - denies Cardiac Cath - denies  Sleep Study - denies CPAP - n/a  Fasting Blood Sugar - n/a Checks Blood Sugar _____ times a day- n/a  Blood Thinner Instructions: n/a Aspirin Instructions: n/a  ERAS Protcol - Yes PRE-SURGERY Ensure or G2- No  COVID TEST- Ambulatory Surgery.   Anesthesia review: No  Patient denies shortness of breath, fever, cough and chest pain at PAT appointment   All instructions explained to the patient, with a verbal understanding of the material. Patient agrees to go over the instructions while at home for a better understanding. The opportunity to ask questions was provided.

## 2021-03-10 ENCOUNTER — Ambulatory Visit (HOSPITAL_COMMUNITY): Payer: No Typology Code available for payment source | Admitting: Anesthesiology

## 2021-03-10 ENCOUNTER — Encounter (HOSPITAL_COMMUNITY)
Admission: RE | Disposition: A | Discharge status: Home / Self Care | Payer: Self-pay | Source: Ambulatory Visit | Attending: General Surgery

## 2021-03-10 ENCOUNTER — Encounter (HOSPITAL_COMMUNITY): Payer: Self-pay | Admitting: General Surgery

## 2021-03-10 ENCOUNTER — Other Ambulatory Visit: Payer: Self-pay

## 2021-03-10 ENCOUNTER — Ambulatory Visit (HOSPITAL_COMMUNITY)
Admission: RE | Admit: 2021-03-10 | Discharge: 2021-03-10 | Disposition: A | Discharge status: Home / Self Care | Payer: No Typology Code available for payment source | Source: Ambulatory Visit | Attending: General Surgery | Admitting: General Surgery

## 2021-03-10 DIAGNOSIS — D171 Benign lipomatous neoplasm of skin and subcutaneous tissue of trunk: Secondary | ICD-10-CM | POA: Insufficient documentation

## 2021-03-10 DIAGNOSIS — D1721 Benign lipomatous neoplasm of skin and subcutaneous tissue of right arm: Secondary | ICD-10-CM | POA: Diagnosis present

## 2021-03-10 DIAGNOSIS — D1722 Benign lipomatous neoplasm of skin and subcutaneous tissue of left arm: Secondary | ICD-10-CM | POA: Insufficient documentation

## 2021-03-10 HISTORY — PX: LIPOMA EXCISION: SHX5283

## 2021-03-10 SURGERY — EXCISION LIPOMA
Anesthesia: General

## 2021-03-10 MED ORDER — ONDANSETRON HCL 4 MG/2ML IJ SOLN
INTRAMUSCULAR | Status: DC | PRN
  Administered 2021-03-10: 4 mg via INTRAVENOUS

## 2021-03-10 MED ORDER — CHLORHEXIDINE GLUCONATE CLOTH 2 % EX PADS: 6.0000 | MEDICATED_PAD | Freq: Once | CUTANEOUS | Status: DC

## 2021-03-10 MED ORDER — PROPOFOL 10 MG/ML IV BOLUS
INTRAVENOUS | Status: AC
  Filled 2021-03-10: qty 20

## 2021-03-10 MED ORDER — PROPOFOL 10 MG/ML IV BOLUS
INTRAVENOUS | Status: DC | PRN
  Administered 2021-03-10: 180 mg via INTRAVENOUS

## 2021-03-10 MED ORDER — LIDOCAINE 2% (20 MG/ML) 5 ML SYRINGE
INTRAMUSCULAR | Status: AC
  Filled 2021-03-10: qty 5

## 2021-03-10 MED ORDER — ORAL CARE MOUTH RINSE: 15.0000 mL | Freq: Once | OROMUCOSAL | Status: AC

## 2021-03-10 MED ORDER — CEFAZOLIN SODIUM-DEXTROSE 2-4 GM/100ML-% IV SOLN
2.0000 g | INTRAVENOUS | Status: AC
  Administered 2021-03-10: 10:00:00 2 g via INTRAVENOUS

## 2021-03-10 MED ORDER — ROCURONIUM BROMIDE 10 MG/ML (PF) SYRINGE
PREFILLED_SYRINGE | INTRAVENOUS | Status: DC | PRN
  Administered 2021-03-10: 100 mg via INTRAVENOUS

## 2021-03-10 MED ORDER — ONDANSETRON HCL 4 MG/2ML IJ SOLN
INTRAMUSCULAR | Status: AC
  Filled 2021-03-10: qty 4

## 2021-03-10 MED ORDER — MIDAZOLAM HCL 2 MG/2ML IJ SOLN
INTRAMUSCULAR | Status: DC | PRN
  Administered 2021-03-10: 2 mg via INTRAVENOUS

## 2021-03-10 MED ORDER — CELECOXIB 200 MG PO CAPS: 400.0000 mg | ORAL_CAPSULE | ORAL | Status: DC

## 2021-03-10 MED ORDER — FENTANYL CITRATE (PF) 250 MCG/5ML IJ SOLN
INTRAMUSCULAR | Status: AC
  Filled 2021-03-10: qty 5

## 2021-03-10 MED ORDER — LACTATED RINGERS IV SOLN: INTRAVENOUS | Status: DC

## 2021-03-10 MED ORDER — CHLORHEXIDINE GLUCONATE 0.12 % MT SOLN
OROMUCOSAL | Status: AC
  Administered 2021-03-10: 09:00:00 15 mL via OROMUCOSAL
  Filled 2021-03-10: qty 15

## 2021-03-10 MED ORDER — BUPIVACAINE-EPINEPHRINE (PF) 0.5% -1:200000 IJ SOLN
INTRAMUSCULAR | Status: DC | PRN
  Administered 2021-03-10: 30 mL

## 2021-03-10 MED ORDER — FENTANYL CITRATE (PF) 250 MCG/5ML IJ SOLN
INTRAMUSCULAR | Status: DC | PRN
  Administered 2021-03-10: 100 ug via INTRAVENOUS

## 2021-03-10 MED ORDER — DEXAMETHASONE SODIUM PHOSPHATE 10 MG/ML IJ SOLN
INTRAMUSCULAR | Status: AC
  Filled 2021-03-10: qty 2

## 2021-03-10 MED ORDER — MIDAZOLAM HCL 2 MG/2ML IJ SOLN
INTRAMUSCULAR | Status: AC
  Filled 2021-03-10: qty 2

## 2021-03-10 MED ORDER — LIDOCAINE 2% (20 MG/ML) 5 ML SYRINGE
INTRAMUSCULAR | Status: DC | PRN
  Administered 2021-03-10: 100 mg via INTRAVENOUS

## 2021-03-10 MED ORDER — SUGAMMADEX SODIUM 200 MG/2ML IV SOLN
INTRAVENOUS | Status: DC | PRN
  Administered 2021-03-10: 200 mg via INTRAVENOUS

## 2021-03-10 MED ORDER — CHLORHEXIDINE GLUCONATE 0.12 % MT SOLN: 15.0000 mL | Freq: Once | OROMUCOSAL | Status: AC

## 2021-03-10 MED ORDER — ACETAMINOPHEN 500 MG PO TABS
1000.0000 mg | ORAL_TABLET | Freq: Once | ORAL | Status: AC
  Administered 2021-03-10: 09:00:00 1000 mg via ORAL
  Filled 2021-03-10: qty 2

## 2021-03-10 MED ORDER — BUPIVACAINE-EPINEPHRINE 0.5% -1:200000 IJ SOLN
INTRAMUSCULAR | Status: AC
  Filled 2021-03-10: qty 1

## 2021-03-10 MED ORDER — ROCURONIUM BROMIDE 10 MG/ML (PF) SYRINGE
PREFILLED_SYRINGE | INTRAVENOUS | Status: AC
  Filled 2021-03-10: qty 10

## 2021-03-10 MED ORDER — DEXAMETHASONE SODIUM PHOSPHATE 10 MG/ML IJ SOLN
INTRAMUSCULAR | Status: DC | PRN
  Administered 2021-03-10: 10 mg via INTRAVENOUS

## 2021-03-10 MED ORDER — CELECOXIB 200 MG PO CAPS
200.0000 mg | ORAL_CAPSULE | Freq: Once | ORAL | Status: AC
  Administered 2021-03-10: 09:00:00 200 mg via ORAL
  Filled 2021-03-10: qty 1

## 2021-03-10 MED ORDER — CEFAZOLIN SODIUM-DEXTROSE 2-4 GM/100ML-% IV SOLN
INTRAVENOUS | Status: AC
  Filled 2021-03-10: qty 100

## 2021-03-10 MED ORDER — GABAPENTIN 300 MG PO CAPS
ORAL_CAPSULE | ORAL | Status: AC
  Administered 2021-03-10: 09:00:00 300 mg via ORAL
  Filled 2021-03-10: qty 1

## 2021-03-10 MED ORDER — OXYCODONE HCL 5 MG PO TABS: 5.0000 mg | ORAL_TABLET | Freq: Four times a day (QID) | ORAL | 0 refills | Status: DC | PRN

## 2021-03-10 MED ORDER — PHENYLEPHRINE 40 MCG/ML (10ML) SYRINGE FOR IV PUSH (FOR BLOOD PRESSURE SUPPORT)
PREFILLED_SYRINGE | INTRAVENOUS | Status: DC | PRN
  Administered 2021-03-10 (×5): 80 ug via INTRAVENOUS

## 2021-03-10 MED ORDER — GABAPENTIN 300 MG PO CAPS: 300.0000 mg | ORAL_CAPSULE | ORAL | Status: AC

## 2021-03-10 SURGICAL SUPPLY — 35 items
ADH SKN CLS APL DERMABOND .7 (GAUZE/BANDAGES/DRESSINGS) ×4
APL PRP STRL LF DISP 70% ISPRP (MISCELLANEOUS) ×2
BAG COUNTER SPONGE SURGICOUNT (BAG) ×3 IMPLANT
BAG SPNG CNTER NS LX DISP (BAG) ×1
BAG SURGICOUNT SPONGE COUNTING (BAG) ×1
CANISTER SUCT 3000ML PPV (MISCELLANEOUS) ×4 IMPLANT
CHLORAPREP W/TINT 26 (MISCELLANEOUS) ×6 IMPLANT
COVER SURGICAL LIGHT HANDLE (MISCELLANEOUS) ×4 IMPLANT
DERMABOND ADVANCED (GAUZE/BANDAGES/DRESSINGS) ×8
DERMABOND ADVANCED .7 DNX12 (GAUZE/BANDAGES/DRESSINGS) IMPLANT
DRAPE LAPAROSCOPIC ABDOMINAL (DRAPES) ×2 IMPLANT
DRAPE LAPAROTOMY 100X72 PEDS (DRAPES) ×6 IMPLANT
DRAPE UTILITY XL STRL (DRAPES) ×4 IMPLANT
ELECT REM PT RETURN 9FT ADLT (ELECTROSURGICAL) ×3
ELECTRODE REM PT RTRN 9FT ADLT (ELECTROSURGICAL) ×2 IMPLANT
GLOVE SRG 8 PF TXTR STRL LF DI (GLOVE) ×2 IMPLANT
GLOVE SURG ENC MOIS LTX SZ8 (GLOVE) ×4 IMPLANT
GLOVE SURG UNDER POLY LF SZ8 (GLOVE) ×3
GOWN STRL REUS W/ TWL LRG LVL3 (GOWN DISPOSABLE) ×2 IMPLANT
GOWN STRL REUS W/ TWL XL LVL3 (GOWN DISPOSABLE) ×2 IMPLANT
GOWN STRL REUS W/TWL LRG LVL3 (GOWN DISPOSABLE) ×3
GOWN STRL REUS W/TWL XL LVL3 (GOWN DISPOSABLE) ×3
KIT BASIN OR (CUSTOM PROCEDURE TRAY) ×4 IMPLANT
KIT TURNOVER KIT B (KITS) ×4 IMPLANT
NS IRRIG 1000ML POUR BTL (IV SOLUTION) ×4 IMPLANT
PACK GENERAL/GYN (CUSTOM PROCEDURE TRAY) ×4 IMPLANT
PAD ARMBOARD 7.5X6 YLW CONV (MISCELLANEOUS) ×8 IMPLANT
PENCIL SMOKE EVACUATOR (MISCELLANEOUS) ×4 IMPLANT
SPECIMEN JAR MEDIUM (MISCELLANEOUS) ×10 IMPLANT
SUT MNCRL AB 4-0 PS2 18 (SUTURE) ×10 IMPLANT
SUT VIC AB 3-0 SH 27 (SUTURE) ×9
SUT VIC AB 3-0 SH 27X BRD (SUTURE) ×2 IMPLANT
SYR CONTROL 10ML LL (SYRINGE) ×2 IMPLANT
TOWEL GREEN STERILE (TOWEL DISPOSABLE) ×4 IMPLANT
TOWEL GREEN STERILE FF (TOWEL DISPOSABLE) ×4 IMPLANT

## 2021-03-10 NOTE — Anesthesia Postprocedure Evaluation (Signed)
Anesthesia Post Note  Patient: Craig Conner  Procedure(s) Performed: EXCISION LIPOMA RIGHT ARM, RIGHT FOREARM, LEFT ARM, AND LEFT BUTTOCK     Patient location during evaluation: PACU Anesthesia Type: General Level of consciousness: sedated Pain management: pain level controlled Vital Signs Assessment: post-procedure vital signs reviewed and stable Respiratory status: spontaneous breathing and respiratory function stable Cardiovascular status: stable Postop Assessment: no apparent nausea or vomiting Anesthetic complications: no   No notable events documented.  Last Vitals:  Vitals:   03/10/21 1130 03/10/21 1145  BP: 116/60 123/66  Pulse: 81 81  Resp: 12 12  Temp: (!) 36.4 C   SpO2: 96% 97%    Last Pain:  Vitals:   03/10/21 1130  TempSrc:   PainSc: Asleep                 Zamya Culhane DANIEL

## 2021-03-10 NOTE — Anesthesia Procedure Notes (Signed)
Procedure Name: Intubation Date/Time: 03/10/2021 10:11 AM Performed by: Carolan Clines, CRNA Pre-anesthesia Checklist: Patient identified, Emergency Drugs available, Suction available and Patient being monitored Patient Re-evaluated:Patient Re-evaluated prior to induction Oxygen Delivery Method: Circle System Utilized Preoxygenation: Pre-oxygenation with 100% oxygen Induction Type: IV induction Ventilation: Mask ventilation without difficulty Laryngoscope Size: Mac and 4 Grade View: Grade I Tube type: Oral Tube size: 7.5 mm Number of attempts: 1 Airway Equipment and Method: Stylet Placement Confirmation: ETT inserted through vocal cords under direct vision, positive ETCO2 and breath sounds checked- equal and bilateral Secured at: 22 cm Tube secured with: Tape Dental Injury: Teeth and Oropharynx as per pre-operative assessment

## 2021-03-10 NOTE — Transfer of Care (Signed)
Immediate Anesthesia Transfer of Care Note  Patient: Craig Conner  Procedure(s) Performed: EXCISION LIPOMA RIGHT ARM, RIGHT FOREARM, LEFT ARM, AND LEFT BUTTOCK  Patient Location: PACU  Anesthesia Type:General  Level of Consciousness: drowsy  Airway & Oxygen Therapy: Patient Spontanous Breathing and Patient connected to nasal cannula oxygen  Post-op Assessment: Report given to RN and Post -op Vital signs reviewed and stable  Post vital signs: Reviewed and stable  Last Vitals:  Vitals Value Taken Time  BP 116/60 03/10/21 1127  Temp    Pulse 84 03/10/21 1127  Resp 13 03/10/21 1127  SpO2 94 % 03/10/21 1127  Vitals shown include unvalidated device data.  Last Pain:  Vitals:   03/10/21 0844  TempSrc:   PainSc: 0-No pain      Patients Stated Pain Goal: 1 (84/16/60 6301)  Complications: No notable events documented.

## 2021-03-10 NOTE — Op Note (Signed)
°  03/10/2021  11:16 AM  PATIENT:  Craig Conner  50 y.o. male  PRE-OPERATIVE DIAGNOSIS:  LIPOMA RIGHT ARM, RIGHT FOREARM, LEFT ARM, AND LEFT BUTTOCK  POST-OPERATIVE DIAGNOSIS:  IPOMA RIGHT ARM, RIGHT FOREARM, LEFT ARM, AND LEFT BUTTOCK  PROCEDURE:  Procedure(s): EXCISION LIPOMA RIGHT ARM 2CM, RIGHT FOREARM 2CM, LEFT ARM 2CM, AND LEFT BUTTOCK 3CM WITH LAYERED CLOSURE  SURGEON:  Surgeon(s): Georganna Skeans, MD  ASSISTANTS: none   ANESTHESIA:   local  EBL:  No intake/output data recorded.  BLOOD ADMINISTERED:none  DRAINS: none   SPECIMEN:  Excision  DISPOSITION OF SPECIMEN:  PATHOLOGY  COUNTS:  YES  DICTATION: .Dragon Dictation Procedure in detail: Informed consent was obtained.  His sites were all marked.  He received intravenous antibiotics.  He was brought to the operating room and general endotracheal anesthesia was administered by the anesthesia staff.  Initially, he was placed in prone position with appropriate padding.  His left buttock and right tricep area were prepped and draped in a sterile fashion.  We did a timeout procedure.  Attention was first directed to the left buttock.  Local was injected and a vertical incision was made over the palpable mass.  Subcutaneous tissues were dissected down revealing a partially encapsulated fatty tumor.  This was completely excised using cautery.  Hemostasis was obtained.  The area was irrigated.  It was a 3 cm mass.  It was closed in layers with deep tissues approximated with interrupted 3-0 Vicryl and the skin closed with 4-0 Monocryl.  Dermabond was applied.  Attention was then directed to the right tricep area.  Local was injected.  A vertical incision was made.  Subcutaneous tissues were dissected down revealing a encapsulated fatty tumor.  This was completely excised using cautery.  It was sent to pathology.  The wound was irrigated and the skin was closed with 4-0 Monocryl subcuticular followed by Dermabond.  The patient was  then placed in supine position with appropriate padding.  We then prepped and draped his right forearm and left bicep region in a sterile fashion.  We did a new timeout procedure.  Attention was first directed to the left bicep region.  This was injected with local and a vertical incision was made.  Subcutaneous tissues were dissected down revealing an encapsulated 2 cm fatty mass.  This was completely excised.  Wound was irrigated and hemostasis was obtained.  It was closed with running 4-0 Monocryl subcuticular followed by Dermabond.  Next, attention was directed to the right forearm.  Local anesthetic was injected.  I made a vertical incision.  Subcutaneous tissues were dissected down revealing an encapsulated 2 cm fatty mass.  This was completely excised.  The wound was irrigated.  We obtained good hemostasis and the wound was closed with running 4-0 Monocryl subcuticular followed by Dermabond.  All counts were correct.  He tolerated the procedure well without apparent complication and was taken recovery in stable condition. PATIENT DISPOSITION:  PACU - hemodynamically stable.   Delay start of Pharmacological VTE agent (>24hrs) due to surgical blood loss or risk of bleeding:  no  Georganna Skeans, MD, MPH, FACS Pager: [phone]  12/22/202211:16 AM

## 2021-03-10 NOTE — Anesthesia Preprocedure Evaluation (Addendum)
Anesthesia Evaluation  Patient identified by MRN, date of birth, ID band Patient awake    Reviewed: Allergy & Precautions, NPO status , Patient's Chart, lab work & pertinent test results  Airway Mallampati: II  TM Distance: >3 FB Neck ROM: Full    Dental no notable dental hx. (+) Dental Advisory Given   Pulmonary neg pulmonary ROS,    Pulmonary exam normal        Cardiovascular negative cardio ROS Normal cardiovascular exam     Neuro/Psych  Headaches, PSYCHIATRIC DISORDERS Anxiety    GI/Hepatic negative GI ROS, Neg liver ROS,   Endo/Other  negative endocrine ROS  Renal/GU negative Renal ROS     Musculoskeletal  (+) Arthritis , Osteoarthritis,    Abdominal   Peds  Hematology negative hematology ROS (+)   Anesthesia Other Findings   Reproductive/Obstetrics                            Anesthesia Physical Anesthesia Plan  ASA: 2  Anesthesia Plan: General   Post-op Pain Management: Celebrex PO (pre-op) and Tylenol PO (pre-op)   Induction: Intravenous  PONV Risk Score and Plan: 3 and Ondansetron, Dexamethasone and Midazolam  Airway Management Planned: Oral ETT  Additional Equipment:   Intra-op Plan:   Post-operative Plan: Extubation in OR  Informed Consent: I have reviewed the patients History and Physical, chart, labs and discussed the procedure including the risks, benefits and alternatives for the proposed anesthesia with the patient or authorized representative who has indicated his/her understanding and acceptance.     Dental advisory given  Plan Discussed with: Anesthesiologist, CRNA and Surgeon  Anesthesia Plan Comments:       Anesthesia Quick Evaluation

## 2021-03-10 NOTE — Interval H&P Note (Signed)
History and Physical Interval Note:  03/10/2021 9:36 AM  Craig Conner  has presented today for surgery, with the diagnosis of LIPOMA RIGHT ARM, RIGHT FOREARM, LEFT ARM, AND LEFT BUTTOCK.  The various methods of treatment have been discussed with the patient and family. After consideration of risks, benefits and other options for treatment, the patient has consented to  Procedure(s): EXCISION LIPOMA RIGHT ARM, RIGHT FOREARM, LEFT ARM, AND LEFT BUTTOCK (N/A) as a surgical intervention.  The patient's history has been reviewed, patient examined, no change in status, stable for surgery.  I have reviewed the patient's chart and labs.  Questions were answered to the patient's satisfaction.     Zenovia Jarred

## 2021-03-11 ENCOUNTER — Encounter (HOSPITAL_COMMUNITY): Payer: Self-pay | Admitting: General Surgery

## 2021-03-11 LAB — SURGICAL PATHOLOGY

## 2021-03-21 NOTE — Progress Notes (Signed)
I added a new note

## 2021-03-21 NOTE — Progress Notes (Signed)
Patient ID: Craig Conner, male   DOB: [birth year], 52 y.o.   MRN: [id number] Final diagnosis: L buttock lipoma, R upper arm lipoma, R forearm angiolipoma, L bicep angiolipoma. Georganna Skeans, MD, MPH, FACS Please use [email or web address] to contact on call provider

## 2021-12-12 ENCOUNTER — Encounter: Payer: Self-pay | Admitting: *Deleted

## 2022-02-03 ENCOUNTER — Ambulatory Visit (INDEPENDENT_AMBULATORY_CARE_PROVIDER_SITE_OTHER): Payer: No Typology Code available for payment source | Admitting: Family Medicine

## 2022-02-03 ENCOUNTER — Encounter: Payer: Self-pay | Admitting: Family Medicine

## 2022-02-03 VITALS — BP 132/82 | HR 88 | Temp 97.5°F | Ht 71.0 in | Wt 191.2 lb

## 2022-02-03 DIAGNOSIS — Z1159 Encounter for screening for other viral diseases: Secondary | ICD-10-CM

## 2022-02-03 DIAGNOSIS — R739 Hyperglycemia, unspecified: Secondary | ICD-10-CM

## 2022-02-03 DIAGNOSIS — G43809 Other migraine, not intractable, without status migrainosus: Secondary | ICD-10-CM | POA: Diagnosis not present

## 2022-02-03 DIAGNOSIS — Z0001 Encounter for general adult medical examination with abnormal findings: Secondary | ICD-10-CM

## 2022-02-03 DIAGNOSIS — Z1211 Encounter for screening for malignant neoplasm of colon: Secondary | ICD-10-CM

## 2022-02-03 DIAGNOSIS — J309 Allergic rhinitis, unspecified: Secondary | ICD-10-CM

## 2022-02-03 DIAGNOSIS — E785 Hyperlipidemia, unspecified: Secondary | ICD-10-CM

## 2022-02-03 MED ORDER — AZELASTINE HCL 137 MCG/SPRAY NA SOLN: NASAL | 1 refills | Status: DC

## 2022-02-03 NOTE — Assessment & Plan Note (Signed)
Continue management per neurology. 

## 2022-02-03 NOTE — Progress Notes (Signed)
Chief Complaint:  Craig Conner is a 51 y.o. male who presents today for his annual comprehensive physical exam.    Assessment/Plan:  Chronic Problems Addressed Today: Hyperglycemia Check A1c.  Dyslipidemia Check lipids.  Discussed lifestyle modifications.  Migraine headache Continue management per neurology.  Allergic rhinitis He has had little bit more persistent rhinorrhea since having COVID.  We will refill his Astelin.  Preventative Healthcare: He will come back for flu and shingles vaccine.  Will refer for colonoscopy.  Check labs today.  Patient Counseling(The following topics were reviewed and/or handout was given):  -Nutrition: Stressed importance of moderation in sodium/caffeine intake, saturated fat and cholesterol, caloric balance, sufficient intake of fresh fruits, vegetables, and fiber.  -Stressed the importance of regular exercise.   -Substance Abuse: Discussed cessation/primary prevention of tobacco, alcohol, or other drug use; driving or other dangerous activities under the influence; availability of treatment for abuse.   -Injury prevention: Discussed safety belts, safety helmets, smoke detector, smoking near bedding or upholstery.   -Sexuality: Discussed sexually transmitted diseases, partner selection, use of condoms, avoidance of unintended pregnancy and contraceptive alternatives.   -Dental health: Discussed importance of regular tooth brushing, flossing, and dental visits.  -Health maintenance and immunizations reviewed. Please refer to Health maintenance section.  Return to care in 1 year for next preventative visit.     Subjective:  HPI:  He has no acute complaints today.   Lifestyle Diet: Working on intermittent fasting. Cutting down on red meat.  Exercise: [employer] and running.      02/03/2022    1:47 PM  Depression screen PHQ 2/9  Decreased Interest 0  Down, Depressed, Hopeless 0  PHQ - 2 Score 0    Health Maintenance Due   Topic Date Due   Hepatitis C Screening  Never done   COLONOSCOPY (Pts 45-31yr Insurance coverage will need to be confirmed)  Never done   COVID-19 Vaccine (4 - Pfizer risk series) 04/17/2020     ROS: Per HPI, otherwise a complete review of systems was negative.   PMH:  The following were reviewed and entered/updated in epic: Past Medical History:  Diagnosis Date   Anal fissure    Arthritis    Blood in stool    Cancer ([place]    skin   Migraine headache    Nasal congestion    Patient Active Problem List   Diagnosis Date Noted   History of skin cancer 04/16/2020   Hyperglycemia 04/16/2020   Dyslipidemia 04/16/2020   Allergic rhinitis 11/13/2008   Migraine headache 10/12/2006   Past Surgical History:  Procedure Laterality Date   ANAL FISSURE REPAIR  2002   LIPOMA EXCISION  2002, 2003   LIPOMA EXCISION N/A 03/10/2021   Procedure: EXCISION LIPOMA RIGHT ARM, RIGHT FOREARM, LEFT ARM, AND LEFT BUTTOCK;  Surgeon: TGeorganna Skeans MD;  [place] [facility]  Service: General;  Laterality: N/A;   PILONIDAL CYST EXCISION  2002   REPAIR HYPOSPADIAS W/ URETHROPLASTY     SKIN CANCER EXCISION  01/2010    Family History  Problem Relation Age of Onset   Migraines Other    Heart disease Father     Medications- reviewed and updated Current Outpatient Medications  Medication Sig Dispense Refill   cetirizine (ZYRTEC) 10 MG tablet Take 10 mg by mouth at bedtime.     Galcanezumab-gnlm (EMGALITY) 120 MG/ML SOAJ Inject 120 mg into the skin every 30 (thirty) days.     nortriptyline (PAMELOR) 50 MG capsule Take 200  mg by mouth at bedtime.     SUMAtriptan (IMITREX) 100 MG tablet Take 100 mg by mouth every 2 (two) hours as needed for migraine.  2   ZEMBRACE SYMTOUCH 3 MG/0.5ML SOAJ Inject 3 mg into the skin daily as needed (migraine).     Azelastine HCl 137 MCG/SPRAY SOLN PLACE 2 SPRAYS INTO BOTH NOSTRILS 2 (TWO) TIMES DAILY 30 mL 1   No current facility-administered medications for this visit.     Allergies-reviewed and updated No Known Allergies  Social History   Socioeconomic History   Marital status: Married    Spouse name: Not on file   Number of children: Not on file   Years of education: Not on file   Highest education level: Not on file  Occupational History   Not on file  Tobacco Use   Smoking status: Never   Smokeless tobacco: Never  Vaping Use   Vaping Use: Never used  Substance and Sexual Activity   Alcohol use: Yes    Comment: rare    Drug use: No   Sexual activity: Not on file  Other Topics Concern   Not on file  Social History Narrative   Not on file   Social Determinants of Health   Financial Resource Strain: Not on file  Food Insecurity: Not on file  Transportation Needs: Not on file  Physical Activity: Not on file  Stress: Not on file  Social Connections: Not on file        Objective:  Physical Exam: BP 132/82   Pulse 88   Temp (!) 97.5 F (36.4 C) (Temporal)   Ht '5\' 11"'$  (1.803 m)   Wt 191 lb 3.2 oz (86.7 kg)   SpO2 98%   BMI 26.67 kg/m   Body mass index is 26.67 kg/m. Wt Readings from Last 3 Encounters:  02/03/22 191 lb 3.2 oz (86.7 kg)  03/10/21 183 lb (83 kg)  03/02/21 192 lb (87.1 kg)   Gen: NAD, resting comfortably HEENT: TMs normal bilaterally. OP clear. No thyromegaly noted.  CV: RRR with no murmurs appreciated Pulm: NWOB, CTAB with no crackles, wheezes, or rhonchi GI: Normal bowel sounds present. Soft, Nontender, Nondistended. MSK: no edema, cyanosis, or clubbing noted Skin: warm, dry Neuro: CN2-12 grossly intact. Strength 5/5 in upper and lower extremities. Reflexes symmetric and intact bilaterally.  Psych: Normal affect and thought content     Khadeejah Castner M. Jerline Pain, MD 02/03/2022 2:14 PM

## 2022-02-03 NOTE — Patient Instructions (Signed)
It was very nice to see you today!  We will check blood work today.  Please try the Astelin to help with your congestion.  I will refer you for your colonoscopy.  We will see you back in year for your next physical.  Please come back to see me sooner if needed.  Take care, Dr Jerline Pain  PLEASE NOTE:  If you had any lab tests please let us know if you have not heard back within a few days. You may see your results on mychart before we have a chance to review them but we will give you a call once they are reviewed by [place]. If we ordered any referrals today, please let us know if you have not heard from their office within the next week.   Please try these tips to maintain a healthy lifestyle:  Eat at least 3 REAL meals and 1-2 snacks per day.  Aim for no more than 5 hours between eating.  If you eat breakfast, please do so within one hour of getting up.   Each meal should contain half fruits/vegetables, one quarter protein, and one quarter carbs (no bigger than a computer mouse)  Cut down on sweet beverages. This includes juice, soda, and sweet tea.   Drink at least 1 glass of water with each meal and aim for at least 8 glasses per day  Exercise at least 150 minutes every week.    Preventive Care 51-57 Years Old, Male Preventive care refers to lifestyle choices and visits with your health care provider that can promote health and wellness. Preventive care visits are also called wellness exams. What can I expect for my preventive care visit? Counseling During your preventive care visit, your health care provider may ask about your: Medical history, including: Past medical problems. Family medical history. Current health, including: Emotional well-being. Home life and relationship well-being. Sexual activity. Lifestyle, including: Alcohol, nicotine or tobacco, and drug use. Access to firearms. Diet, exercise, and sleep habits. Safety issues such as seatbelt and bike helmet  use. Sunscreen use. Work and work Statistician. Physical exam Your health care provider will check your: Height and weight. These may be used to calculate your BMI (body mass index). BMI is a measurement that tells if you are at a healthy weight. Waist circumference. This measures the distance around your waistline. This measurement also tells if you are at a healthy weight and may help predict your risk of certain diseases, such as type 2 diabetes and high blood pressure. Heart rate and blood pressure. Body temperature. Skin for abnormal spots. What immunizations do I need?  Vaccines are usually given at various ages, according to a schedule. Your health care provider will recommend vaccines for you based on your age, medical history, and lifestyle or other factors, such as travel or where you work. What tests do I need? Screening Your health care provider may recommend screening tests for certain conditions. This may include: Lipid and cholesterol levels. Diabetes screening. This is done by checking your blood sugar (glucose) after you have not eaten for a while (fasting). Hepatitis B test. Hepatitis C test. HIV (human immunodeficiency virus) test. STI (sexually transmitted infection) testing, if you are at risk. Lung cancer screening. Prostate cancer screening. Colorectal cancer screening. Talk with your health care provider about your test results, treatment options, and if necessary, the need for more tests. Follow these instructions at home: Eating and drinking  Eat a diet that includes fresh fruits and vegetables, whole grains,  lean protein, and low-fat dairy products. Take vitamin and mineral supplements as recommended by your health care provider. Do not drink alcohol if your health care provider tells you not to drink. If you drink alcohol: Limit how much you have to 0-2 drinks a day. Know how much alcohol is in your drink. In the [place] one drink equals one 12 oz bottle of  beer (355 mL), one 5 oz glass of wine (148 mL), or one 1 oz glass of hard liquor (44 mL). Lifestyle Brush your teeth every morning and night with fluoride toothpaste. Floss one time each day. Exercise for at least 30 minutes 5 or more days each week. Do not use any products that contain nicotine or tobacco. These products include cigarettes, chewing tobacco, and vaping devices, such as e-cigarettes. If you need help quitting, ask your health care provider. Do not use drugs. If you are sexually active, practice safe sex. Use a condom or other form of protection to prevent STIs. Take aspirin only as told by your health care provider. Make sure that you understand how much to take and what form to take. Work with your health care provider to find out whether it is safe and beneficial for you to take aspirin daily. Find healthy ways to manage stress, such as: Meditation, yoga, or listening to music. Journaling. Talking to a trusted person. Spending time with friends and family. Minimize exposure to UV radiation to reduce your risk of skin cancer. Safety Always wear your seat belt while driving or riding in a vehicle. Do not drive: If you have been drinking alcohol. Do not ride with someone who has been drinking. When you are tired or distracted. While texting. If you have been using any mind-altering substances or drugs. Wear a helmet and other protective equipment during sports activities. If you have firearms in your house, make sure you follow all gun safety procedures. What's next? Go to your health care provider once a year for an annual wellness visit. Ask your health care provider how often you should have your eyes and teeth checked. Stay up to date on all vaccines. This information is not intended to replace advice given to you by your health care provider. Make sure you discuss any questions you have with your health care provider. Document Revised: 09/01/2020 Document Reviewed:  09/01/2020 Elsevier Patient Education  [employer].

## 2022-02-03 NOTE — Assessment & Plan Note (Signed)
Check lipids. Discussed lifestyle modifications.  

## 2022-02-03 NOTE — Assessment & Plan Note (Signed)
He has had little bit more persistent rhinorrhea since having COVID.  We will refill his Astelin.

## 2022-02-03 NOTE — Assessment & Plan Note (Signed)
Check A1c. 

## 2022-02-04 LAB — CBC
HCT: 37.3 % — ABNORMAL LOW (ref 38.5–50.0)
Hemoglobin: 12.9 g/dL — ABNORMAL LOW (ref 13.2–17.1)
MCH: 32 pg (ref 27.0–33.0)
MCHC: 34.6 g/dL (ref 32.0–36.0)
MCV: 92.6 fL (ref 80.0–100.0)
MPV: 11.1 fL (ref 7.5–12.5)
Platelets: 127 10*3/uL — ABNORMAL LOW (ref 140–400)
RBC: 4.03 10*6/uL — ABNORMAL LOW (ref 4.20–5.80)
RDW: 12 % (ref 11.0–15.0)
WBC: 4.9 10*3/uL (ref 3.8–10.8)

## 2022-02-04 LAB — COMPREHENSIVE METABOLIC PANEL
AG Ratio: 1.7 (calc) (ref 1.0–2.5)
ALT: 26 U/L (ref 9–46)
AST: 20 U/L (ref 10–35)
Albumin: 3.8 g/dL (ref 3.6–5.1)
Alkaline phosphatase (APISO): 60 U/L (ref 35–144)
BUN: 10 mg/dL (ref 7–25)
CO2: 27 mmol/L (ref 20–32)
Calcium: 8.9 mg/dL (ref 8.6–10.3)
Chloride: 105 mmol/L (ref 98–110)
Creat: 1.01 mg/dL (ref 0.70–1.30)
Globulin: 2.2 g/dL (calc) (ref 1.9–3.7)
Glucose, Bld: 85 mg/dL (ref 65–99)
Potassium: 3.9 mmol/L (ref 3.5–5.3)
Sodium: 142 mmol/L (ref 135–146)
Total Bilirubin: 0.3 mg/dL (ref 0.2–1.2)
Total Protein: 6 g/dL — ABNORMAL LOW (ref 6.1–8.1)

## 2022-02-04 LAB — LIPID PANEL
Cholesterol: 144 mg/dL (ref <200)
HDL: 35 mg/dL — ABNORMAL LOW (ref >=40)
LDL Cholesterol (Calc): 79 mg/dL (calc)
Non-HDL Cholesterol (Calc): 109 mg/dL (calc) (ref <130)
Total CHOL/HDL Ratio: 4.1 (calc) (ref <5.0)
Triglycerides: 205 mg/dL — ABNORMAL HIGH (ref <150)

## 2022-02-04 LAB — HEMOGLOBIN A1C
Hgb A1c MFr Bld: 5.1 % of total Hgb (ref <5.7)
Mean Plasma Glucose: 100 mg/dL
eAG (mmol/L): 5.5 mmol/L

## 2022-02-04 LAB — HEPATITIS C ANTIBODY: Hepatitis C Ab: NONREACTIVE

## 2022-02-04 LAB — TSH: TSH: 0.81 mIU/L (ref 0.40–4.50)

## 2022-02-07 NOTE — Progress Notes (Signed)
Please inform patient of the following:  Blood counts have dropped a little bit since last time but everything else is stable compared to previous values.  Cholesterol is better than last year.  I would like for him to come back to make sure his blood counts are stable.  Please place future order for CBC, iron panel, and B12.  We can recheck every thing else in a year.

## 2022-02-08 ENCOUNTER — Other Ambulatory Visit: Payer: Self-pay

## 2022-02-08 ENCOUNTER — Telehealth: Payer: Self-pay | Admitting: Family Medicine

## 2022-02-08 DIAGNOSIS — Z0001 Encounter for general adult medical examination with abnormal findings: Secondary | ICD-10-CM

## 2022-02-08 DIAGNOSIS — R739 Hyperglycemia, unspecified: Secondary | ICD-10-CM

## 2022-02-08 NOTE — Telephone Encounter (Signed)
Pt states: -Recent labs show low white blood cells -Recently saw Urologist and was diagnosed with a prostate infection.  -Will complete antibiotics on Dec. 2nd.  -Redraw of blood is schedule for 03/06/22  Pt Asks: -Is there enough time for body to rebound have antibiotic is finish and when the redraw of blood appt.   Pt requests: -Return phone call or my chart message.

## 2022-02-13 NOTE — Telephone Encounter (Signed)
Patient aware, will wait till next OV for blood drawn

## 2022-02-13 NOTE — Telephone Encounter (Signed)
Please advise 

## 2022-02-13 NOTE — Telephone Encounter (Signed)
YEs that is ok to get his blood drawn on that date.  Craig Conner. Jerline Pain, MD 02/13/2022 12:44 PM

## 2022-03-06 ENCOUNTER — Other Ambulatory Visit (INDEPENDENT_AMBULATORY_CARE_PROVIDER_SITE_OTHER): Payer: No Typology Code available for payment source

## 2022-03-06 DIAGNOSIS — Z0001 Encounter for general adult medical examination with abnormal findings: Secondary | ICD-10-CM | POA: Diagnosis not present

## 2022-03-06 DIAGNOSIS — R739 Hyperglycemia, unspecified: Secondary | ICD-10-CM | POA: Diagnosis not present

## 2022-03-06 LAB — CBC
HCT: 41.8 % (ref 39.0–52.0)
Hemoglobin: 14.4 g/dL (ref 13.0–17.0)
MCHC: 34.4 g/dL (ref 30.0–36.0)
MCV: 90.4 fl (ref 78.0–100.0)
Platelets: 119 10*3/uL — ABNORMAL LOW (ref 150.0–400.0)
RBC: 4.62 Mil/uL (ref 4.22–5.81)
RDW: 12.4 % (ref 11.5–15.5)
WBC: 5.1 10*3/uL (ref 4.0–10.5)

## 2022-03-06 LAB — B12 AND FOLATE PANEL
Folate: 17 ng/mL (ref >5.9)
Vitamin B-12: 408 pg/mL (ref 211–911)

## 2022-03-07 LAB — IRON,TIBC AND FERRITIN PANEL
%SAT: 29 % (calc) (ref 20–48)
Ferritin: 68 ng/mL (ref 38–380)
Iron: 97 ug/dL (ref 50–180)
TIBC: 333 mcg/dL (calc) (ref 250–425)

## 2022-03-09 NOTE — Progress Notes (Signed)
Please inform patient of the following:  Good news!  Blood counts are back to normal.  His B12 and iron levels are all normal as well.  Do not need to do any further testing at this point.  We can recheck at his next visit.

## 2022-07-04 ENCOUNTER — Other Ambulatory Visit: Payer: Self-pay | Admitting: Family Medicine

## 2022-09-30 ENCOUNTER — Other Ambulatory Visit: Payer: Self-pay | Admitting: Family Medicine

## 2023-08-29 ENCOUNTER — Ambulatory Visit: Payer: Self-pay | Admitting: Family Medicine

## 2023-08-29 ENCOUNTER — Encounter: Payer: Self-pay | Admitting: Family Medicine

## 2023-08-29 VITALS — BP 119/76 | HR 77 | Temp 97.9°F | Ht 71.0 in | Wt 194.2 lb

## 2023-08-29 DIAGNOSIS — J029 Acute pharyngitis, unspecified: Secondary | ICD-10-CM

## 2023-08-29 DIAGNOSIS — E785 Hyperlipidemia, unspecified: Secondary | ICD-10-CM

## 2023-08-29 DIAGNOSIS — Z1211 Encounter for screening for malignant neoplasm of colon: Secondary | ICD-10-CM

## 2023-08-29 DIAGNOSIS — G43809 Other migraine, not intractable, without status migrainosus: Secondary | ICD-10-CM

## 2023-08-29 DIAGNOSIS — Z125 Encounter for screening for malignant neoplasm of prostate: Secondary | ICD-10-CM

## 2023-08-29 DIAGNOSIS — R739 Hyperglycemia, unspecified: Secondary | ICD-10-CM

## 2023-08-29 LAB — C-REACTIVE PROTEIN: CRP: 3.6 mg/dL (ref 0.5–20.0)

## 2023-08-29 LAB — SEDIMENTATION RATE: Sed Rate: 15 mm/h (ref 0–20)

## 2023-08-29 LAB — POCT RAPID STREP A (OFFICE): Rapid Strep A Screen: POSITIVE — AB

## 2023-08-29 MED ORDER — AMOXICILLIN 500 MG PO CAPS
500.0000 mg | ORAL_CAPSULE | Freq: Three times a day (TID) | ORAL | 0 refills | Status: AC
Start: 2023-08-29 — End: 2023-09-08

## 2023-08-29 NOTE — Assessment & Plan Note (Signed)
 Check A1c.

## 2023-08-29 NOTE — Assessment & Plan Note (Signed)
 On nortriptyline and Emgality per neurology.

## 2023-08-29 NOTE — Assessment & Plan Note (Signed)
 Check lipids

## 2023-08-29 NOTE — Progress Notes (Signed)
   Craig Conner is a 53 y.o. male who presents today for an office visit.  Assessment/Plan:  New/Acute Problems: Sore Throat Rapid strep positive.  Will start amoxicillin .  Encouraged hydration.  He likely does have some chemical irritation due to his recent episode of vomiting after a pill became stuck in his throat however this should hopefully improve with conservative measures.  Likely did have smoldering strep infection prior which precipitated the pill becoming stuck in his throat.  Anticipate that this will improve over the next week or so.  We discussed reasons to return to care.  Chronic Problems Addressed Today: Hyperglycemia Check A1c.   Dyslipidemia Check lipids.   Migraine headache On nortriptyline and Emgality per neurology.  Preventative health care  Check labs.  Will refer for colonoscopy.  Due for shingles vaccine however he will defer this until he recovers from his strep as above.    Subjective:  HPI:  See A/P for status of chronic conditions.  Patient is here today with sore throat.  This started 2 days ago.  Was in his usual state of health when he was taking his normal nortriptyline.  He felt like this became lodged in his throat.  Afterwards had a significant mount of coughing, gagging, and vomiting.  This went on for about an hour or so.  Since then he has had persistent burning sensation in his throat.  No reported fevers or chills.  Symptoms been stable the last day or so.  No reported hematemesis.  No cough or runny nose.  No fevers or chills.  No reported melena or hematochezia.  No reported constipation or diarrhea.       Objective:  Physical Exam: BP 119/76   Pulse 77   Temp 97.9 F (36.6 C) (Temporal)   Ht 5' 11 (1.803 m)   Wt 194 lb 3.2 oz (88.1 kg)   SpO2 95%   BMI 27.09 kg/m   Gen: No acute distress, resting comfortably HEENT: OP erythematous. CV: Regular rate and rhythm with no murmurs appreciated Pulm: Normal work of breathing, clear  to auscultation bilaterally with no crackles, wheezes, or rhonchi Neuro: Grossly normal, moves all extremities Psych: Normal affect and thought content      Chaselynn Kepple M. Daneil Dunker, MD 08/29/2023 8:11 AM

## 2023-08-29 NOTE — Patient Instructions (Signed)
 It was very nice to see you today!  You have strep throat.  Start the amoxicillin .  Let us  know if not improving.  Will check blood work today and refer you for your colonoscopy.  Return in about 1 year (around 08/28/2024) for Annual Physical.   Take care, Dr Daneil Dunker  PLEASE NOTE:  If you had any lab tests, please let us  know if you have not heard back within a few days. You may see your results on mychart before we have a chance to review them but we will give you a call once they are reviewed by us .   If we ordered any referrals today, please let us  know if you have not heard from their office within the next week.   If you had any urgent prescriptions sent in today, please check with the pharmacy within an hour of our visit to make sure the prescription was transmitted appropriately.   Please try these tips to maintain a healthy lifestyle:  Eat at least 3 REAL meals and 1-2 snacks per day.  Aim for no more than 5 hours between eating.  If you eat breakfast, please do so within one hour of getting up.   Each meal should contain half fruits/vegetables, one quarter protein, and one quarter carbs (no bigger than a computer mouse)  Cut down on sweet beverages. This includes juice, soda, and sweet tea.   Drink at least 1 glass of water with each meal and aim for at least 8 glasses per day  Exercise at least 150 minutes every week.

## 2023-08-30 ENCOUNTER — Ambulatory Visit: Payer: Self-pay | Admitting: Family Medicine

## 2023-08-30 LAB — CBC
HCT: 44.8 % (ref 38.5–50.0)
Hemoglobin: 14.9 g/dL (ref 13.2–17.1)
MCH: 31 pg (ref 27.0–33.0)
MCHC: 33.3 g/dL (ref 32.0–36.0)
MCV: 93.1 fL (ref 80.0–100.0)
MPV: 10.5 fL (ref 7.5–12.5)
Platelets: 150 10*3/uL (ref 140–400)
RBC: 4.81 10*6/uL (ref 4.20–5.80)
RDW: 12.5 % (ref 11.0–15.0)
WBC: 5.7 10*3/uL (ref 3.8–10.8)

## 2023-08-30 LAB — COMPREHENSIVE METABOLIC PANEL WITH GFR
AG Ratio: 1.7 (calc) (ref 1.0–2.5)
ALT: 18 U/L (ref 9–46)
AST: 19 U/L (ref 10–35)
Albumin: 4.3 g/dL (ref 3.6–5.1)
Alkaline phosphatase (APISO): 70 U/L (ref 35–144)
BUN: 11 mg/dL (ref 7–25)
CO2: 27 mmol/L (ref 20–32)
Calcium: 9.8 mg/dL (ref 8.6–10.3)
Chloride: 102 mmol/L (ref 98–110)
Creat: 1.1 mg/dL (ref 0.70–1.30)
Globulin: 2.6 g/dL (ref 1.9–3.7)
Glucose, Bld: 95 mg/dL (ref 65–99)
Potassium: 4 mmol/L (ref 3.5–5.3)
Sodium: 140 mmol/L (ref 135–146)
Total Bilirubin: 0.8 mg/dL (ref 0.2–1.2)
Total Protein: 6.9 g/dL (ref 6.1–8.1)
eGFR: 81 mL/min/{1.73_m2} (ref >=60)

## 2023-08-30 LAB — LIPID PANEL
Cholesterol: 217 mg/dL — ABNORMAL HIGH (ref <200)
HDL: 42 mg/dL (ref >=40)
LDL Cholesterol (Calc): 138 mg/dL — ABNORMAL HIGH
Non-HDL Cholesterol (Calc): 175 mg/dL — ABNORMAL HIGH (ref <130)
Total CHOL/HDL Ratio: 5.2 (calc) — ABNORMAL HIGH (ref <5.0)
Triglycerides: 230 mg/dL — ABNORMAL HIGH (ref <150)

## 2023-08-30 LAB — IRON,TIBC AND FERRITIN PANEL
%SAT: 29 % (ref 20–48)
Ferritin: 106 ng/mL (ref 38–380)
Iron: 97 ug/dL (ref 50–180)
TIBC: 334 ug/dL (ref 250–425)

## 2023-08-30 LAB — HEMOGLOBIN A1C
Hgb A1c MFr Bld: 5.2 % (ref <5.7)
Mean Plasma Glucose: 103 mg/dL
eAG (mmol/L): 5.7 mmol/L

## 2023-08-30 LAB — TSH: TSH: 1.02 m[IU]/L (ref 0.40–4.50)

## 2023-08-30 LAB — PSA: PSA: 2.78 ng/mL (ref <=4.00)

## 2023-08-30 LAB — B12 AND FOLATE PANEL
Folate: 20 ng/mL
Vitamin B-12: 1115 pg/mL — ABNORMAL HIGH (ref 200–1100)

## 2023-08-30 NOTE — Progress Notes (Signed)
 Cholesterol is up a bit since the last time we checked. We do not need to start medications for this but he should work on diet and exercise and we can recheck in a year.   All of his other labs are at goal.

## 2024-01-09 ENCOUNTER — Encounter: Payer: Self-pay | Admitting: Gastroenterology

## 2024-02-08 ENCOUNTER — Ambulatory Visit (AMBULATORY_SURGERY_CENTER): Payer: Self-pay | Admitting: *Deleted

## 2024-02-08 VITALS — Ht 71.0 in | Wt 192.0 lb

## 2024-02-08 DIAGNOSIS — Z1211 Encounter for screening for malignant neoplasm of colon: Secondary | ICD-10-CM

## 2024-02-08 MED ORDER — NA SULFATE-K SULFATE-MG SULF 17.5-3.13-1.6 GM/177ML PO SOLN: 1.0000 | Freq: Once | ORAL | 0 refills | Status: AC

## 2024-02-08 NOTE — Progress Notes (Signed)
 Pre visit completed over telephone. Instructions forwarded through MyChart.  No egg or soy allergy known to patient  No issues known to pt with past sedation with any surgeries or procedures Patient denies ever being told they had issues or difficulty with intubation  No FH of Malignant Hyperthermia Pt is not on diet pills Pt is not on  home 02  Pt is not on blood thinners  Pt denies issues with constipation  No A fib or A flutter Have any cardiac testing pending-- NO Pt instructed to use Singlecare.com or GoodRx for a price reduction on prep    Chart previously reviewed by DOROTHA Schillings, CRNA

## 2024-02-22 ENCOUNTER — Encounter: Payer: Self-pay | Admitting: Gastroenterology

## 2024-02-22 ENCOUNTER — Ambulatory Visit: Payer: Self-pay | Admitting: Gastroenterology

## 2024-02-22 VITALS — BP 117/74 | HR 82 | Temp 98.1°F | Resp 11 | Ht 71.0 in | Wt 192.0 lb

## 2024-02-22 DIAGNOSIS — Z83719 Family history of colon polyps, unspecified: Secondary | ICD-10-CM

## 2024-02-22 DIAGNOSIS — Z1211 Encounter for screening for malignant neoplasm of colon: Secondary | ICD-10-CM

## 2024-02-22 MED ORDER — PEG 3350-KCL-NA BICARB-NACL 420 G PO SOLR: 4000.0000 mL | Freq: Once | ORAL | 0 refills | Status: AC

## 2024-02-22 NOTE — Progress Notes (Signed)
 Pt's states no medical or surgical changes since previsit or office visit.

## 2024-02-22 NOTE — Patient Instructions (Addendum)
 YOU HAD AN ENDOSCOPIC PROCEDURE TODAY AT THE [facility]:   Refer to the procedure report that was given to you for any specific questions about what was found during the examination.  If the procedure report does not answer your questions, please call your gastroenterologist to clarify.  If you requested that your care partner not be given the details of your procedure findings, then the procedure report has been included in a sealed envelope for you to review at your convenience later.  YOU SHOULD EXPECT: Some feelings of bloating in the abdomen. Passage of more gas than usual.  Walking can help get rid of the air that was put into your GI tract during the procedure and reduce the bloating. If you had a lower endoscopy (such as a colonoscopy or flexible sigmoidoscopy) you may notice spotting of blood in your stool or on the toilet paper. If you underwent a bowel prep for your procedure, you may not have a normal bowel movement for a few days.  Please Note:  You might notice some irritation and congestion in your nose or some drainage.  This is from the oxygen used during your procedure.  There is no need for concern and it should clear up in a day or so.  SYMPTOMS TO REPORT IMMEDIATELY:  Following lower endoscopy (colonoscopy or flexible sigmoidoscopy):  Excessive amounts of blood in the stool  Significant tenderness or worsening of abdominal pains  Swelling of the abdomen that is new, acute  Fever of 100F or higher   For urgent or emergent issues, a gastroenterologist can be reached at any hour by calling [phone]. Do not use MyChart messaging for urgent concerns.    DIET:  We do recommend a small meal at first, but then you may proceed to your regular diet.  Drink plenty of fluids but you should avoid alcoholic beverages for 24 hours.  MEDICATIONS:   Continue present medications.  FOLLOW UP: Repeat colonoscopy for CRC screening after 2 day prep. Scheduled for 03/28/2023  at 3:00pm.  ACTIVITY:  You should plan to take it easy for the rest of today and you should NOT DRIVE or use heavy machinery until tomorrow (because of the sedation medicines used during the test).    FOLLOW UP: Our staff will call the number listed on your records the next business day following your procedure.  We will call around 7:15- 8:00 am to check on you and address any questions or concerns that you may have regarding the information given to you following your procedure. If we do not reach you, we will leave a message.     If any biopsies were taken you will be contacted by phone or by letter within the next 1-3 weeks.  Please call us  at [phone] if you have not heard about the biopsies in 3 weeks.    SIGNATURES/CONFIDENTIALITY: You and/or your care partner have signed paperwork which will be entered into your electronic medical record.  These signatures attest to the fact that that the information above on your After Visit Summary has been reviewed and is understood.  Full responsibility of the confidentiality of this discharge information lies with you and/or your care-partner.

## 2024-02-22 NOTE — Progress Notes (Signed)
 [facility] History and Physical   Primary Care Physician:  Kennyth Worth HERO, MD   Reason for Procedure:   CRC screening-   High risk - FH  colonic polyps. In Twin brother.  Plan:    colon   The patient was provided an opportunity to ask questions and all were answered. The patient agreed with the plan.   HPI: Craig Conner is a 53 y.o. male    Past Medical History:  Diagnosis Date   Anal fissure    Arthritis    Blood in stool    Cancer (HCC)    skin   Migraine headache    Nasal congestion     Past Surgical History:  Procedure Laterality Date   ANAL FISSURE REPAIR  2002   LIPOMA EXCISION  2002, 2003   LIPOMA EXCISION N/A 03/10/2021   Procedure: EXCISION LIPOMA RIGHT ARM, RIGHT FOREARM, LEFT ARM, AND LEFT BUTTOCK;  Surgeon: Sebastian Moles, MD;  Location: [facility] OR;  Service: General;  Laterality: N/A;   PILONIDAL CYST EXCISION  2002   REPAIR HYPOSPADIAS W/ URETHROPLASTY     SKIN CANCER EXCISION  01/2010    Prior to Admission medications   Medication Sig Start Date End Date Taking? Authorizing Provider  cetirizine (ZYRTEC) 10 MG tablet Take 10 mg by mouth at bedtime.   Yes [provider]  nortriptyline (PAMELOR) 50 MG capsule Take 200 mg by mouth at bedtime. 04/28/16  Yes [provider]  QULIPTA 60 MG TABS Take 1 tablet by mouth daily. 01/14/24  Yes [provider]  Azelastine  HCl 137 MCG/SPRAY SOLN PLACE 2 SPRAYS INTO BOTH NOSTRILS 2 (TWO) TIMES DAILY 10/02/22   Kennyth Worth HERO, MD  BOTOX 100 units SOLR injection  07/23/23   [provider]  Galcanezumab-gnlm (EMGALITY) 120 MG/ML SOAJ Inject 120 mg into the skin every 30 (thirty) days. Patient not taking: Reported on 02/08/2024 07/28/20   [provider]  SUMAtriptan  (IMITREX ) 100 MG tablet Take 100 mg by mouth every 2 (two) hours as needed for migraine. Patient not taking: Reported on 02/08/2024 04/17/14   [provider]  ZEMBRACE SYMTOUCH  3 MG/0.5ML SOAJ  Inject 3 mg into the skin daily as needed (migraine). 09/02/20   [provider]    Current Outpatient Medications  Medication Sig Dispense Refill   cetirizine (ZYRTEC) 10 MG tablet Take 10 mg by mouth at bedtime.     nortriptyline (PAMELOR) 50 MG capsule Take 200 mg by mouth at bedtime.     QULIPTA 60 MG TABS Take 1 tablet by mouth daily.     Azelastine  HCl 137 MCG/SPRAY SOLN PLACE 2 SPRAYS INTO BOTH NOSTRILS 2 (TWO) TIMES DAILY 30 mL 11   BOTOX 100 units SOLR injection      Galcanezumab-gnlm (EMGALITY) 120 MG/ML SOAJ Inject 120 mg into the skin every 30 (thirty) days. (Patient not taking: Reported on 02/08/2024)     SUMAtriptan  (IMITREX ) 100 MG tablet Take 100 mg by mouth every 2 (two) hours as needed for migraine. (Patient not taking: Reported on 02/08/2024)  2   ZEMBRACE SYMTOUCH  3 MG/0.5ML SOAJ Inject 3 mg into the skin daily as needed (migraine).     No current facility-administered medications for this visit.    Allergies as of 02/22/2024   (No Known Allergies)    Family History  Problem Relation Age of Onset   Heart disease Father    Stomach cancer Maternal Grandmother    Migraines Other    Colon  cancer Neg Hx    Esophageal cancer Neg Hx    Rectal cancer Neg Hx     Social History   Socioeconomic History   Marital status: Married    Spouse name: Not on file   Number of children: Not on file   Years of education: Not on file   Highest education level: Not on file  Occupational History   Not on file  Tobacco Use   Smoking status: Never   Smokeless tobacco: Never  Vaping Use   Vaping status: Never Used  Substance and Sexual Activity   Alcohol use: Yes    Comment: rare    Drug use: No   Sexual activity: Not on file  Other Topics Concern   Not on file  Social History Narrative   Not on file   Social Drivers of Health   Financial Resource Strain: Not on file  Food Insecurity: Not on file  Transportation Needs: Not on file  Physical Activity: Not  on file  Stress: Not on file  Social Connections: Not on file  Intimate Partner Violence: Not on file    Review of Systems: Positive for none All other review of systems negative except as mentioned in the HPI.  Physical Exam: Vital signs in last 24 hours: @VSRANGES @   General:   Alert,  Well-developed, well-nourished, pleasant and cooperative in NAD Lungs:  Clear throughout to auscultation.   Heart:  Regular rate and rhythm; no murmurs, clicks, rubs,  or gallops. Abdomen:  Soft, nontender and nondistended. Normal bowel sounds.   Neuro/Psych:  Alert and cooperative. Normal mood and affect. A and O x 3    No significant changes were identified.  The patient continues to be an appropriate candidate for the planned procedure and anesthesia.   Anselm Bring, MD. [facility] 02/22/2024 2:15 PM@

## 2024-02-22 NOTE — Progress Notes (Signed)
 Report to PACU, RN, vss, BBS= Clear.

## 2024-02-22 NOTE — Op Note (Signed)
 [facility] Patient Name: Craig Conner Procedure Date: 02/22/2024 2:10 PM MRN: [id number] Endoscopist: Lynnie Bring , MD, [id number] Age: 53 Referring MD:  Date of Birth: 1971/03/14 Gender: Male Account #: [id number] Procedure:                Colonoscopy Indications:              Screening for colorectal malignant neoplasm-high                            risk. FH of colon polyps in twin brother. Medicines:                Monitored Anesthesia Care Procedure:                Pre-Anesthesia Assessment:                           - Prior to the procedure, a History and Physical                            was performed, and patient medications and                            allergies were reviewed. The patient's tolerance of                            previous anesthesia was also reviewed. The risks                            and benefits of the procedure and the sedation                            options and risks were discussed with the patient.                            All questions were answered, and informed consent                            was obtained. Prior Anticoagulants: The patient has                            taken no anticoagulant or antiplatelet agents. ASA                            Grade Assessment: II - A patient with mild systemic                            disease. After reviewing the risks and benefits,                            the patient was deemed in satisfactory condition to                            undergo the procedure.  After obtaining informed consent, the colonoscope                            was passed under direct vision. Throughout the                            procedure, the patient's blood pressure, pulse, and                            oxygen saturations were monitored continuously. The                            Olympus Scope SN [id number] was introduced through the                            anus with the  intention of advancing to the cecum.                            The scope was advanced to the sigmoid colon before                            the procedure was aborted. Medications were given.                            The colonoscopy was performed without difficulty.                            The patient tolerated the procedure well. The                            quality of the bowel preparation was unsatisfactory. Scope In: 2:21:50 PM Scope Out: 2:23:00 PM Total Procedure Duration: 0 hours 1 minute 10 seconds  Findings:                 A moderate amount of solid stool was found in the                            rectum, in the recto-sigmoid colon and in the                            sigmoid colon, precluding visualization. Complications:            No immediate complications. Estimated Blood Loss:     Estimated blood loss: none. Impression:               - Preparation of the colon was unsatisfactory.                           - Stool in the rectum, in the recto-sigmoid colon                            and in the sigmoid colon.                           -  No specimens collected. Recommendation:           - Patient has a contact number available for                            emergencies. The signs and symptoms of potential                            delayed complications were discussed with the                            patient. Return to normal activities tomorrow.                            Written discharge instructions were provided to the                            patient.                           - Repeat colonoscopy for CRC screening after 2-day                            prep.                           - The findings and recommendations were discussed                            with the patient's family. Lynnie Bring, MD 02/22/2024 2:28:30 PM This report has been signed electronically.

## 2024-02-25 ENCOUNTER — Telehealth: Payer: Self-pay

## 2024-02-25 NOTE — Telephone Encounter (Signed)
 Attempted to reach patient for follow up phone call. No answer, left voicemail to contact Dr. Hobert Lull office with any questions or concerns.

## 2024-03-27 ENCOUNTER — Encounter: Admitting: Gastroenterology

## 2024-04-16 ENCOUNTER — Encounter: Payer: Self-pay | Admitting: Gastroenterology

## 2024-04-25 ENCOUNTER — Encounter: Admitting: Gastroenterology

## 2024-05-02 ENCOUNTER — Encounter: Admitting: Gastroenterology
